# Patient Record
Sex: Female | Born: 1970 | Race: White | Hispanic: No | Marital: Married | Smoking: Current every day smoker
Health system: Southern US, Community
[De-identification: ages and names within clinical notes are randomized; demographics above are authoritative.]

## PROBLEM LIST (undated history)

## (undated) DIAGNOSIS — F4310 Post-traumatic stress disorder, unspecified: Secondary | ICD-10-CM

## (undated) DIAGNOSIS — Z8659 Personal history of other mental and behavioral disorders: Secondary | ICD-10-CM

## (undated) DIAGNOSIS — G4730 Sleep apnea, unspecified: Secondary | ICD-10-CM

## (undated) DIAGNOSIS — J449 Chronic obstructive pulmonary disease, unspecified: Secondary | ICD-10-CM

## (undated) DIAGNOSIS — E079 Disorder of thyroid, unspecified: Secondary | ICD-10-CM

## (undated) DIAGNOSIS — F329 Major depressive disorder, single episode, unspecified: Secondary | ICD-10-CM

## (undated) DIAGNOSIS — Q433 Congenital malformations of intestinal fixation: Secondary | ICD-10-CM

## (undated) DIAGNOSIS — M1990 Unspecified osteoarthritis, unspecified site: Secondary | ICD-10-CM

## (undated) DIAGNOSIS — F32A Depression, unspecified: Secondary | ICD-10-CM

## (undated) DIAGNOSIS — F909 Attention-deficit hyperactivity disorder, unspecified type: Secondary | ICD-10-CM

## (undated) DIAGNOSIS — F419 Anxiety disorder, unspecified: Secondary | ICD-10-CM

## (undated) DIAGNOSIS — F319 Bipolar disorder, unspecified: Secondary | ICD-10-CM

## (undated) DIAGNOSIS — J45909 Unspecified asthma, uncomplicated: Secondary | ICD-10-CM

## (undated) HISTORY — DX: Anxiety disorder, unspecified: F41.9

## (undated) HISTORY — DX: Personal history of other mental and behavioral disorders: Z86.59

## (undated) HISTORY — DX: Disorder of thyroid, unspecified: E07.9

## (undated) HISTORY — PX: ABDOMINAL SURGERY: SHX537

## (undated) HISTORY — DX: Post-traumatic stress disorder, unspecified: F43.10

## (undated) HISTORY — DX: Bipolar disorder, unspecified: F31.9

## (undated) HISTORY — DX: Attention-deficit hyperactivity disorder, unspecified type: F90.9

## (undated) HISTORY — DX: Major depressive disorder, single episode, unspecified: F32.9

## (undated) HISTORY — DX: Depression, unspecified: F32.A

## (undated) HISTORY — PX: ABDOMINAL HYSTERECTOMY: SHX81

---

## 2003-07-04 ENCOUNTER — Emergency Department (HOSPITAL_COMMUNITY): Admission: EM | Admit: 2003-07-04 | Discharge: 2003-07-04 | Payer: Self-pay | Admitting: Emergency Medicine

## 2005-03-19 ENCOUNTER — Ambulatory Visit: Payer: Self-pay | Admitting: Family Medicine

## 2005-08-20 ENCOUNTER — Emergency Department: Payer: Self-pay | Admitting: Internal Medicine

## 2005-09-01 ENCOUNTER — Emergency Department: Payer: Self-pay | Admitting: Emergency Medicine

## 2005-09-27 ENCOUNTER — Emergency Department: Payer: Self-pay | Admitting: Emergency Medicine

## 2005-09-30 ENCOUNTER — Other Ambulatory Visit: Payer: Self-pay

## 2005-09-30 ENCOUNTER — Inpatient Hospital Stay: Payer: Self-pay | Admitting: Internal Medicine

## 2005-09-30 ENCOUNTER — Emergency Department: Payer: Self-pay | Admitting: Emergency Medicine

## 2005-10-18 ENCOUNTER — Other Ambulatory Visit: Payer: Self-pay

## 2005-10-19 ENCOUNTER — Observation Stay: Payer: Self-pay | Admitting: Internal Medicine

## 2005-11-25 ENCOUNTER — Ambulatory Visit: Payer: Self-pay | Admitting: Physician Assistant

## 2006-01-28 ENCOUNTER — Ambulatory Visit: Payer: Self-pay

## 2006-02-01 ENCOUNTER — Other Ambulatory Visit: Payer: Self-pay

## 2006-02-01 ENCOUNTER — Emergency Department: Payer: Self-pay | Admitting: Emergency Medicine

## 2006-02-18 ENCOUNTER — Emergency Department: Payer: Self-pay | Admitting: Emergency Medicine

## 2006-03-24 ENCOUNTER — Emergency Department: Payer: Self-pay | Admitting: Internal Medicine

## 2006-10-11 ENCOUNTER — Emergency Department: Payer: Self-pay | Admitting: Emergency Medicine

## 2006-11-15 ENCOUNTER — Emergency Department: Payer: Self-pay | Admitting: Unknown Physician Specialty

## 2006-11-15 ENCOUNTER — Other Ambulatory Visit: Payer: Self-pay

## 2007-01-09 ENCOUNTER — Inpatient Hospital Stay: Payer: Self-pay | Admitting: Unknown Physician Specialty

## 2007-01-09 ENCOUNTER — Other Ambulatory Visit: Payer: Self-pay

## 2007-02-20 ENCOUNTER — Emergency Department: Payer: Self-pay | Admitting: Emergency Medicine

## 2007-04-30 ENCOUNTER — Emergency Department: Payer: Self-pay | Admitting: Emergency Medicine

## 2007-05-01 ENCOUNTER — Ambulatory Visit: Payer: Self-pay | Admitting: Podiatry

## 2008-01-08 ENCOUNTER — Ambulatory Visit: Payer: Self-pay | Admitting: Family Medicine

## 2008-02-08 ENCOUNTER — Emergency Department: Payer: Self-pay

## 2008-03-16 ENCOUNTER — Emergency Department: Payer: Self-pay | Admitting: Unknown Physician Specialty

## 2008-04-17 ENCOUNTER — Emergency Department: Payer: Self-pay | Admitting: Emergency Medicine

## 2008-05-07 ENCOUNTER — Other Ambulatory Visit: Payer: Self-pay

## 2008-05-07 ENCOUNTER — Inpatient Hospital Stay: Payer: Self-pay | Admitting: Internal Medicine

## 2008-05-08 ENCOUNTER — Inpatient Hospital Stay: Payer: Self-pay | Admitting: Psychiatry

## 2008-07-24 ENCOUNTER — Ambulatory Visit: Payer: Self-pay | Admitting: Urology

## 2008-10-10 ENCOUNTER — Encounter
Admission: RE | Admit: 2008-10-10 | Discharge: 2008-10-10 | Payer: Self-pay | Admitting: Physical Medicine & Rehabilitation

## 2009-01-17 ENCOUNTER — Inpatient Hospital Stay: Payer: Self-pay | Admitting: Psychiatry

## 2009-02-11 ENCOUNTER — Inpatient Hospital Stay: Payer: Self-pay | Admitting: Internal Medicine

## 2009-06-09 ENCOUNTER — Inpatient Hospital Stay: Payer: Self-pay | Admitting: Psychiatry

## 2009-08-11 ENCOUNTER — Ambulatory Visit: Payer: Self-pay | Admitting: Pain Medicine

## 2009-08-29 ENCOUNTER — Ambulatory Visit: Payer: Self-pay | Admitting: Unknown Physician Specialty

## 2009-09-08 ENCOUNTER — Ambulatory Visit: Payer: Self-pay | Admitting: Pain Medicine

## 2009-09-24 ENCOUNTER — Emergency Department: Payer: Self-pay | Admitting: Emergency Medicine

## 2009-11-27 ENCOUNTER — Ambulatory Visit: Payer: Self-pay | Admitting: Pain Medicine

## 2009-12-08 ENCOUNTER — Ambulatory Visit: Payer: Self-pay | Admitting: Pain Medicine

## 2010-04-28 ENCOUNTER — Ambulatory Visit: Payer: Self-pay | Admitting: Pain Medicine

## 2010-05-04 ENCOUNTER — Ambulatory Visit: Payer: Self-pay | Admitting: Pain Medicine

## 2010-05-26 ENCOUNTER — Ambulatory Visit: Payer: Self-pay | Admitting: Pain Medicine

## 2010-06-03 ENCOUNTER — Ambulatory Visit: Payer: Self-pay | Admitting: Pain Medicine

## 2010-06-23 ENCOUNTER — Ambulatory Visit: Payer: Self-pay | Admitting: Pain Medicine

## 2010-06-26 ENCOUNTER — Emergency Department: Payer: Self-pay | Admitting: Internal Medicine

## 2010-07-01 ENCOUNTER — Ambulatory Visit: Payer: Self-pay | Admitting: Pain Medicine

## 2010-07-06 ENCOUNTER — Emergency Department: Payer: Self-pay | Admitting: Emergency Medicine

## 2010-07-13 ENCOUNTER — Ambulatory Visit: Payer: Self-pay | Admitting: Gastroenterology

## 2010-08-04 ENCOUNTER — Ambulatory Visit: Payer: Self-pay | Admitting: Pain Medicine

## 2010-08-12 ENCOUNTER — Ambulatory Visit: Payer: Self-pay

## 2010-08-20 ENCOUNTER — Inpatient Hospital Stay: Payer: Self-pay

## 2010-08-21 LAB — PATHOLOGY REPORT

## 2010-09-01 ENCOUNTER — Ambulatory Visit: Payer: Self-pay | Admitting: Pain Medicine

## 2011-03-15 ENCOUNTER — Inpatient Hospital Stay: Payer: Self-pay | Admitting: Internal Medicine

## 2011-03-17 ENCOUNTER — Inpatient Hospital Stay: Payer: Self-pay | Admitting: Psychiatry

## 2011-08-31 ENCOUNTER — Emergency Department: Payer: Self-pay | Admitting: Emergency Medicine

## 2011-09-01 ENCOUNTER — Emergency Department (HOSPITAL_COMMUNITY)
Admission: EM | Admit: 2011-09-01 | Discharge: 2011-09-01 | Disposition: A | Discharge status: Home / Self Care | Payer: Medicaid Other | Attending: Emergency Medicine | Admitting: Emergency Medicine

## 2011-09-01 ENCOUNTER — Emergency Department (HOSPITAL_COMMUNITY): Payer: Medicaid Other

## 2011-09-01 DIAGNOSIS — F319 Bipolar disorder, unspecified: Secondary | ICD-10-CM | POA: Insufficient documentation

## 2011-09-01 DIAGNOSIS — F209 Schizophrenia, unspecified: Secondary | ICD-10-CM | POA: Insufficient documentation

## 2011-09-01 DIAGNOSIS — R51 Headache: Secondary | ICD-10-CM | POA: Insufficient documentation

## 2011-09-01 DIAGNOSIS — E7800 Pure hypercholesterolemia, unspecified: Secondary | ICD-10-CM | POA: Insufficient documentation

## 2011-09-01 DIAGNOSIS — T148XXA Other injury of unspecified body region, initial encounter: Secondary | ICD-10-CM | POA: Insufficient documentation

## 2011-09-01 DIAGNOSIS — M129 Arthropathy, unspecified: Secondary | ICD-10-CM | POA: Insufficient documentation

## 2011-09-01 DIAGNOSIS — F341 Dysthymic disorder: Secondary | ICD-10-CM | POA: Insufficient documentation

## 2011-09-03 ENCOUNTER — Emergency Department: Payer: Self-pay | Admitting: Unknown Physician Specialty

## 2012-01-01 ENCOUNTER — Emergency Department: Payer: Self-pay | Admitting: Emergency Medicine

## 2012-01-16 ENCOUNTER — Emergency Department: Payer: Self-pay | Admitting: Emergency Medicine

## 2012-01-17 LAB — URINALYSIS, COMPLETE
Bilirubin,UR: NEGATIVE
Ph: 5 (ref 4.5–8.0)
RBC,UR: 3 /HPF (ref 0–5)
Specific Gravity: 1.026 (ref 1.003–1.030)
WBC UR: 1 /HPF (ref 0–5)

## 2012-01-17 LAB — CBC
HCT: 39.7 % (ref 35.0–47.0)
HGB: 13.4 g/dL (ref 12.0–16.0)
MCV: 91 fL (ref 80–100)
RBC: 4.36 10*6/uL (ref 3.80–5.20)
RDW: 13.4 % (ref 11.5–14.5)
WBC: 11.3 10*3/uL — ABNORMAL HIGH (ref 3.6–11.0)

## 2012-01-17 LAB — DRUG SCREEN, URINE
Barbiturates, Ur Screen: NEGATIVE (ref ?–200)
Cannabinoid 50 Ng, Ur ~~LOC~~: POSITIVE (ref ?–50)
MDMA (Ecstasy)Ur Screen: NEGATIVE (ref ?–500)

## 2012-01-17 LAB — COMPREHENSIVE METABOLIC PANEL
Albumin: 3.8 g/dL (ref 3.4–5.0)
Alkaline Phosphatase: 110 U/L (ref 50–136)
BUN: 17 mg/dL (ref 7–18)
Bilirubin,Total: 0.4 mg/dL (ref 0.2–1.0)
Creatinine: 0.75 mg/dL (ref 0.60–1.30)
Glucose: 117 mg/dL — ABNORMAL HIGH (ref 65–99)
Osmolality: 288 (ref 275–301)
SGPT (ALT): 38 U/L
Sodium: 143 mmol/L (ref 136–145)

## 2012-01-17 LAB — ETHANOL
Ethanol %: <0.003 % (ref 0.000–0.080)
Ethanol: <3 mg/dL

## 2012-01-17 LAB — ACETAMINOPHEN LEVEL: Acetaminophen: <2 ug/mL

## 2012-01-17 LAB — SALICYLATE LEVEL: Salicylates, Serum: 4.3 mg/dL — ABNORMAL HIGH

## 2012-01-17 LAB — PREGNANCY, URINE: Pregnancy Test, Urine: NEGATIVE m[IU]/mL

## 2012-01-17 LAB — TSH: Thyroid Stimulating Horm: 3.3 u[IU]/mL

## 2012-04-09 ENCOUNTER — Emergency Department: Payer: Self-pay | Admitting: *Deleted

## 2012-07-13 ENCOUNTER — Emergency Department: Payer: Self-pay | Admitting: Emergency Medicine

## 2012-07-13 LAB — BASIC METABOLIC PANEL
Anion Gap: 6 — ABNORMAL LOW (ref 7–16)
BUN: 12 mg/dL (ref 7–18)
Chloride: 110 mmol/L — ABNORMAL HIGH (ref 98–107)
Creatinine: 0.83 mg/dL (ref 0.60–1.30)
EGFR (Non-African Amer.): >60
Glucose: 109 mg/dL — ABNORMAL HIGH (ref 65–99)
Osmolality: 287 (ref 275–301)
Potassium: 3.4 mmol/L — ABNORMAL LOW (ref 3.5–5.1)

## 2012-07-15 ENCOUNTER — Inpatient Hospital Stay: Payer: Self-pay | Admitting: Student

## 2012-07-15 LAB — COMPREHENSIVE METABOLIC PANEL
Alkaline Phosphatase: 85 U/L (ref 50–136)
BUN: 14 mg/dL (ref 7–18)
Creatinine: 0.9 mg/dL (ref 0.60–1.30)
EGFR (Non-African Amer.): >60
Glucose: 89 mg/dL (ref 65–99)
Osmolality: 287 (ref 275–301)
Potassium: 3.7 mmol/L (ref 3.5–5.1)
Sodium: 144 mmol/L (ref 136–145)

## 2012-07-15 LAB — URINALYSIS, COMPLETE
Bacteria: NONE SEEN
Glucose,UR: NEGATIVE mg/dL (ref 0–75)
Leukocyte Esterase: NEGATIVE
Nitrite: NEGATIVE
Protein: NEGATIVE
RBC,UR: <1 /HPF (ref 0–5)
Specific Gravity: 1.03 (ref 1.003–1.030)
WBC UR: NONE SEEN /HPF (ref 0–5)

## 2012-07-15 LAB — DRUG SCREEN, URINE
Amphetamines, Ur Screen: NEGATIVE (ref ?–1000)
Cocaine Metabolite,Ur ~~LOC~~: NEGATIVE (ref ?–300)
MDMA (Ecstasy)Ur Screen: NEGATIVE (ref ?–500)
Methadone, Ur Screen: NEGATIVE (ref ?–300)
Opiate, Ur Screen: POSITIVE (ref ?–300)
Tricyclic, Ur Screen: NEGATIVE (ref ?–1000)

## 2012-07-15 LAB — CBC
HCT: 40.4 % (ref 35.0–47.0)
HGB: 13 g/dL (ref 12.0–16.0)
MCHC: 32.3 g/dL (ref 32.0–36.0)
WBC: 9.2 10*3/uL (ref 3.6–11.0)

## 2012-07-15 LAB — PREGNANCY, URINE: Pregnancy Test, Urine: NEGATIVE m[IU]/mL

## 2012-07-15 LAB — ETHANOL
Ethanol %: <0.003 % (ref 0.000–0.080)
Ethanol: <3 mg/dL

## 2012-07-15 LAB — ACETAMINOPHEN LEVEL: Acetaminophen: 23 ug/mL

## 2012-07-15 LAB — TSH: Thyroid Stimulating Horm: 0.51 u[IU]/mL

## 2012-07-16 LAB — BASIC METABOLIC PANEL
BUN: 11 mg/dL (ref 7–18)
Chloride: 116 mmol/L — ABNORMAL HIGH (ref 98–107)
EGFR (Non-African Amer.): >60
Glucose: 92 mg/dL (ref 65–99)

## 2012-07-16 LAB — CBC WITH DIFFERENTIAL/PLATELET
Basophil #: 0.1 10*3/uL (ref 0.0–0.1)
Eosinophil %: 1.5 %
HGB: 11 g/dL — ABNORMAL LOW (ref 12.0–16.0)
Lymphocyte %: 24.8 %
MCH: 29.5 pg (ref 26.0–34.0)
MCV: 91 fL (ref 80–100)
Monocyte %: 4.5 %
Neutrophil #: 7.6 10*3/uL — ABNORMAL HIGH (ref 1.4–6.5)
RBC: 3.74 10*6/uL — ABNORMAL LOW (ref 3.80–5.20)

## 2012-07-16 LAB — COMPREHENSIVE METABOLIC PANEL: Bilirubin,Total: 0.2 mg/dL (ref 0.2–1.0)

## 2013-07-20 DIAGNOSIS — E041 Nontoxic single thyroid nodule: Secondary | ICD-10-CM | POA: Insufficient documentation

## 2013-07-20 DIAGNOSIS — E785 Hyperlipidemia, unspecified: Secondary | ICD-10-CM | POA: Insufficient documentation

## 2013-07-20 DIAGNOSIS — F1911 Other psychoactive substance abuse, in remission: Secondary | ICD-10-CM | POA: Insufficient documentation

## 2013-07-20 DIAGNOSIS — G4700 Insomnia, unspecified: Secondary | ICD-10-CM | POA: Insufficient documentation

## 2013-07-20 DIAGNOSIS — R635 Abnormal weight gain: Secondary | ICD-10-CM | POA: Insufficient documentation

## 2013-07-20 DIAGNOSIS — M549 Dorsalgia, unspecified: Secondary | ICD-10-CM | POA: Insufficient documentation

## 2013-07-20 DIAGNOSIS — Z8679 Personal history of other diseases of the circulatory system: Secondary | ICD-10-CM | POA: Insufficient documentation

## 2013-12-24 ENCOUNTER — Emergency Department: Payer: Self-pay | Admitting: Emergency Medicine

## 2014-01-04 ENCOUNTER — Emergency Department: Payer: Self-pay | Admitting: Emergency Medicine

## 2014-06-17 ENCOUNTER — Emergency Department: Payer: Self-pay | Admitting: Emergency Medicine

## 2014-06-17 LAB — URINALYSIS, COMPLETE
Bacteria: NONE SEEN
Bilirubin,UR: NEGATIVE
GLUCOSE, UR: NEGATIVE mg/dL (ref 0–75)
Leukocyte Esterase: NEGATIVE
NITRITE: NEGATIVE
Ph: 6 (ref 4.5–8.0)
Protein: 30
RBC,UR: 5 /HPF (ref 0–5)
SPECIFIC GRAVITY: 1.024 (ref 1.003–1.030)
Squamous Epithelial: 1
WBC UR: 1 /HPF (ref 0–5)

## 2014-06-17 LAB — COMPREHENSIVE METABOLIC PANEL
ALK PHOS: 133 U/L — AB
ALT: 19 U/L (ref 12–78)
ANION GAP: 14 (ref 7–16)
AST: 15 U/L (ref 15–37)
Albumin: 3.8 g/dL (ref 3.4–5.0)
BUN: 13 mg/dL (ref 7–18)
Bilirubin,Total: 0.7 mg/dL (ref 0.2–1.0)
Calcium, Total: 9.7 mg/dL (ref 8.5–10.1)
Chloride: 104 mmol/L (ref 98–107)
Co2: 19 mmol/L — ABNORMAL LOW (ref 21–32)
Creatinine: 0.73 mg/dL (ref 0.60–1.30)
GLUCOSE: 85 mg/dL (ref 65–99)
OSMOLALITY: 273 (ref 275–301)
Potassium: 3.3 mmol/L — ABNORMAL LOW (ref 3.5–5.1)
Sodium: 137 mmol/L (ref 136–145)
TOTAL PROTEIN: 8.3 g/dL — AB (ref 6.4–8.2)

## 2014-06-17 LAB — CBC
HCT: 45.9 % (ref 35.0–47.0)
HGB: 14.6 g/dL (ref 12.0–16.0)
MCH: 27.3 pg (ref 26.0–34.0)
MCHC: 31.9 g/dL — ABNORMAL LOW (ref 32.0–36.0)
MCV: 86 fL (ref 80–100)
PLATELETS: 214 10*3/uL (ref 150–440)
RBC: 5.36 10*6/uL — AB (ref 3.80–5.20)
RDW: 14.6 % — AB (ref 11.5–14.5)
WBC: 16.6 10*3/uL — ABNORMAL HIGH (ref 3.6–11.0)

## 2014-06-17 LAB — DRUG SCREEN, URINE
AMPHETAMINES, UR SCREEN: NEGATIVE (ref ?–1000)
BARBITURATES, UR SCREEN: NEGATIVE (ref ?–200)
Benzodiazepine, Ur Scrn: POSITIVE (ref ?–200)
COCAINE METABOLITE, UR ~~LOC~~: NEGATIVE (ref ?–300)
Cannabinoid 50 Ng, Ur ~~LOC~~: POSITIVE (ref ?–50)
MDMA (Ecstasy)Ur Screen: NEGATIVE (ref ?–500)
Methadone, Ur Screen: NEGATIVE (ref ?–300)
Opiate, Ur Screen: NEGATIVE (ref ?–300)
Phencyclidine (PCP) Ur S: NEGATIVE (ref ?–25)
TRICYCLIC, UR SCREEN: NEGATIVE (ref ?–1000)

## 2014-09-22 ENCOUNTER — Observation Stay: Payer: Self-pay | Admitting: Internal Medicine

## 2014-09-22 LAB — ETHANOL: Ethanol: <3 mg/dL

## 2014-09-22 LAB — URINALYSIS, COMPLETE
BILIRUBIN, UR: NEGATIVE
Blood: NEGATIVE
Glucose,UR: NEGATIVE mg/dL (ref 0–75)
KETONE: NEGATIVE
Leukocyte Esterase: NEGATIVE
NITRITE: NEGATIVE
PROTEIN: NEGATIVE
Ph: 5 (ref 4.5–8.0)
Specific Gravity: 1.025 (ref 1.003–1.030)

## 2014-09-22 LAB — COMPREHENSIVE METABOLIC PANEL
ALBUMIN: 3.7 g/dL (ref 3.4–5.0)
ALT: 16 U/L
ANION GAP: 5 — AB (ref 7–16)
Alkaline Phosphatase: 128 U/L — ABNORMAL HIGH
BILIRUBIN TOTAL: 0.5 mg/dL (ref 0.2–1.0)
BUN: 13 mg/dL (ref 7–18)
CALCIUM: 8.5 mg/dL (ref 8.5–10.1)
CO2: 28 mmol/L (ref 21–32)
CREATININE: 0.85 mg/dL (ref 0.60–1.30)
Chloride: 103 mmol/L (ref 98–107)
EGFR (African American): >60
GLUCOSE: 78 mg/dL (ref 65–99)
OSMOLALITY: 271 (ref 275–301)
POTASSIUM: 3.4 mmol/L — AB (ref 3.5–5.1)
SGOT(AST): 17 U/L (ref 15–37)
SODIUM: 136 mmol/L (ref 136–145)
TOTAL PROTEIN: 7.7 g/dL (ref 6.4–8.2)

## 2014-09-22 LAB — DRUG SCREEN, URINE
Amphetamines, Ur Screen: POSITIVE (ref ?–1000)
BARBITURATES, UR SCREEN: NEGATIVE (ref ?–200)
Benzodiazepine, Ur Scrn: POSITIVE (ref ?–200)
CANNABINOID 50 NG, UR ~~LOC~~: POSITIVE (ref ?–50)
Cocaine Metabolite,Ur ~~LOC~~: NEGATIVE (ref ?–300)
MDMA (ECSTASY) UR SCREEN: NEGATIVE (ref ?–500)
METHADONE, UR SCREEN: NEGATIVE (ref ?–300)
OPIATE, UR SCREEN: POSITIVE (ref ?–300)
PHENCYCLIDINE (PCP) UR S: NEGATIVE (ref ?–25)
Tricyclic, Ur Screen: NEGATIVE (ref ?–1000)

## 2014-09-22 LAB — SALICYLATE LEVEL
SALICYLATES, SERUM: 3.8 mg/dL — AB
Salicylates, Serum: 4.4 mg/dL — ABNORMAL HIGH

## 2014-09-22 LAB — CBC
HCT: 42.6 % (ref 35.0–47.0)
HGB: 13.7 g/dL (ref 12.0–16.0)
MCH: 28.9 pg (ref 26.0–34.0)
MCHC: 32.2 g/dL (ref 32.0–36.0)
MCV: 90 fL (ref 80–100)
PLATELETS: 216 10*3/uL (ref 150–440)
RBC: 4.74 10*6/uL (ref 3.80–5.20)
RDW: 14.4 % (ref 11.5–14.5)
WBC: 10.8 10*3/uL (ref 3.6–11.0)

## 2014-09-22 LAB — AMMONIA: AMMONIA, PLASMA: 24 umol/L (ref 11–32)

## 2014-09-22 LAB — ACETAMINOPHEN LEVEL: Acetaminophen: <2 ug/mL

## 2014-09-22 LAB — TSH: Thyroid Stimulating Horm: 1.5 u[IU]/mL

## 2015-03-08 ENCOUNTER — Emergency Department (HOSPITAL_COMMUNITY)
Admission: EM | Admit: 2015-03-08 | Discharge: 2015-03-08 | Disposition: A | Discharge status: Home / Self Care | Payer: Medicare Other | Attending: Emergency Medicine | Admitting: Emergency Medicine

## 2015-03-08 ENCOUNTER — Emergency Department (HOSPITAL_COMMUNITY): Payer: Medicare Other

## 2015-03-08 ENCOUNTER — Encounter (HOSPITAL_COMMUNITY): Payer: Self-pay

## 2015-03-08 DIAGNOSIS — W108XXA Fall (on) (from) other stairs and steps, initial encounter: Secondary | ICD-10-CM | POA: Diagnosis not present

## 2015-03-08 DIAGNOSIS — Z720 Tobacco use: Secondary | ICD-10-CM | POA: Insufficient documentation

## 2015-03-08 DIAGNOSIS — Z880 Allergy status to penicillin: Secondary | ICD-10-CM | POA: Diagnosis not present

## 2015-03-08 DIAGNOSIS — Y998 Other external cause status: Secondary | ICD-10-CM | POA: Insufficient documentation

## 2015-03-08 DIAGNOSIS — J449 Chronic obstructive pulmonary disease, unspecified: Secondary | ICD-10-CM | POA: Insufficient documentation

## 2015-03-08 DIAGNOSIS — Z8669 Personal history of other diseases of the nervous system and sense organs: Secondary | ICD-10-CM | POA: Insufficient documentation

## 2015-03-08 DIAGNOSIS — S46002A Unspecified injury of muscle(s) and tendon(s) of the rotator cuff of left shoulder, initial encounter: Secondary | ICD-10-CM

## 2015-03-08 DIAGNOSIS — Y9289 Other specified places as the place of occurrence of the external cause: Secondary | ICD-10-CM | POA: Insufficient documentation

## 2015-03-08 DIAGNOSIS — S4992XA Unspecified injury of left shoulder and upper arm, initial encounter: Secondary | ICD-10-CM | POA: Diagnosis not present

## 2015-03-08 DIAGNOSIS — Y9389 Activity, other specified: Secondary | ICD-10-CM | POA: Insufficient documentation

## 2015-03-08 HISTORY — DX: Sleep apnea, unspecified: G47.30

## 2015-03-08 HISTORY — DX: Chronic obstructive pulmonary disease, unspecified: J44.9

## 2015-03-08 MED ORDER — OXYCODONE-ACETAMINOPHEN 5-325 MG PO TABS
1.0000 | ORAL_TABLET | Freq: Once | ORAL | Status: AC
  Administered 2015-03-08: 1 via ORAL
  Filled 2015-03-08: qty 1

## 2015-03-08 MED ORDER — HYDROCODONE-ACETAMINOPHEN 5-325 MG PO TABS: ORAL_TABLET | ORAL | Status: DC

## 2015-03-08 NOTE — ED Notes (Signed)
Pt reports yesterday pt moving dresser fell down 4 stairs landed on left shoulder, pt unable to move left arm d/t pain.

## 2015-03-08 NOTE — Discharge Instructions (Signed)
Only use the arm sling for up to 2 days. Take the arm out and rotate the shoulder every 4 hours.  Take vicodin for breakthrough pain, do not drink alcohol, drive, care for children or do other critical tasks while taking vicodin.  Please follow with your primary care doctor in the next 2 days for a check-up. They must obtain records for further management.   Do not hesitate to return to the Emergency Department for any new, worsening or concerning symptoms.    Rotator Cuff Injury Rotator cuff injury is any type of injury to the set of muscles and tendons that make up the stabilizing unit of your shoulder. This unit holds the ball of your upper arm bone (humerus) in the socket of your shoulder blade (scapula).  CAUSES Injuries to your rotator cuff most commonly come from sports or activities that cause your arm to be moved repeatedly over your head. Examples of this include throwing, weight lifting, swimming, or racquet sports. Long lasting (chronic) irritation of your rotator cuff can cause soreness and swelling (inflammation), bursitis, and eventual damage to your tendons, such as a tear (rupture). SIGNS AND SYMPTOMS Acute rotator cuff tear:  Sudden tearing sensation followed by severe pain shooting from your upper shoulder down your arm toward your elbow.  Decreased range of motion of your shoulder because of pain and muscle spasm.  Severe pain.  Inability to raise your arm out to the side because of pain and loss of muscle power (large tears). Chronic rotator cuff tear:  Pain that usually is worse at night and may interfere with sleep.  Gradual weakness and decreased shoulder motion as the pain worsens.  Decreased range of motion. Rotator cuff tendinitis:  Deep ache in your shoulder and the outside upper arm over your shoulder.  Pain that comes on gradually and becomes worse when lifting your arm to the side or turning it inward. DIAGNOSIS Rotator cuff injury is diagnosed  through a medical history, physical exam, and imaging exam. The medical history helps determine the type of rotator cuff injury. Your health care provider will look at your injured shoulder, feel the injured area, and ask you to move your shoulder in different positions. X-ray exams typically are done to rule out other causes of shoulder pain, such as fractures. MRI is the exam of choice for the most severe shoulder injuries because the images show muscles and tendons.  TREATMENT  Chronic tear:  Medicine for pain, such as acetaminophen or ibuprofen.  Physical therapy and range-of-motion exercises may be helpful in maintaining shoulder function and strength.  Steroid injections into your shoulder joint.  Surgical repair of the rotator cuff if the injury does not heal with noninvasive treatment. Acute tear:  Anti-inflammatory medicines such as ibuprofen and naproxen to help reduce pain and swelling.  A sling to help support your arm and rest your rotator cuff muscles. Long-term use of a sling is not advised. It may cause significant stiffening of the shoulder joint.  Surgery may be considered within a few weeks, especially in younger, active people, to return the shoulder to full function.  Indications for surgical treatment include the following:  Age younger than 60 years.  Rotator cuff tears that are complete.  Physical therapy, rest, and anti-inflammatory medicines have been used for 6-8 weeks, with no improvement.  Employment or sporting activity that requires constant shoulder use. Tendinitis:  Anti-inflammatory medicines such as ibuprofen and naproxen to help reduce pain and swelling.  A sling to  help support your arm and rest your rotator cuff muscles. Long-term use of a sling is not advised. It may cause significant stiffening of the shoulder joint.  Severe tendinitis may require:  Steroid injections into your shoulder joint.  Physical therapy.  Surgery. HOME CARE  INSTRUCTIONS   Apply ice to your injury:  Put ice in a plastic bag.  Place a towel between your skin and the bag.  Leave the ice on for 20 minutes, 2-3 times a day.  If you have a shoulder immobilizer (sling and straps), wear it until told otherwise by your health care provider.  You may want to sleep on several pillows or in a recliner at night to lessen swelling and pain.  Only take over-the-counter or prescription medicines for pain, discomfort, or fever as directed by your health care provider.  Do simple hand squeezing exercises with a soft rubber ball to decrease hand swelling. SEEK MEDICAL CARE IF:   Your shoulder pain increases, or new pain or numbness develops in your arm, hand, or fingers.  Your hand or fingers are colder than your other hand. SEEK IMMEDIATE MEDICAL CARE IF:   Your arm, hand, or fingers are numb or tingling.  Your arm, hand, or fingers are increasingly swollen and painful, or they turn white or blue. MAKE SURE YOU:  Understand these instructions.  Will watch your condition.  Will get help right away if you are not doing well or get worse. Document Released: 12/10/2000 Document Revised: 12/18/2013 Document Reviewed: 07/25/2013 [facility] Patient Information 2015 [employer], [place]. This information is not intended to replace advice given to you by your health care provider. Make sure you discuss any questions you have with your health care provider.

## 2015-03-08 NOTE — ED Provider Notes (Signed)
CSN: [id number]     Arrival date & time 03/08/15  1332 History  This chart was scribed for Destiny Emery, PA-C, working with Benjiman Core, MD by Elon Spanner, ED Scribe. This patient was seen in room TR06C/TR06C and the patient's care was started at 2:30 PM.   Chief Complaint  Patient presents with  . Shoulder Pain   The history is provided by the patient. No language interpreter was used.   HPI Comments: Destiny Simmons is a 44 y.o. female who presents to the Emergency Department complaining of a left shoulder injury that occurred yesterday at noon.  She reports that she was moving her dresser when she fell down 4 stairs, landing on her back and left shoulder.   She denies head trauma, LOC during fall.  She complains currently of 8/10 left shoulder pain.  She has taken ibuprofen without relief.  Denies numbness, weakness. States pain is exacerbated by movement and palpation. Prior history of trauma or surgeries to the affected joint  Past Medical History  Diagnosis Date  . Sleep apnea   . COPD (chronic obstructive pulmonary disease)    Past Surgical History  Procedure Laterality Date  . Abdominal hysterectomy     History reviewed. No pertinent family history. History  Substance Use Topics  . Smoking status: Current Every Day Smoker -- 1.00 packs/day    Types: Cigarettes  . Smokeless tobacco: Not on file  . Alcohol Use: No   OB History    No data available     Review of Systems A complete 10 system review of systems was obtained and all systems are negative except as noted in the HPI and PMH.    Allergies  Penicillins  Home Medications   Prior to Admission medications   Not on File   BP 126/90 mmHg  Pulse 86  Temp(Src) 98 F (36.7 C) (Oral)  Resp 18  SpO2 98% Physical Exam  Constitutional: She is oriented to person, place, and time. She appears well-developed and well-nourished. No distress.  HENT:  Head: Normocephalic and atraumatic.  Mouth/Throat:  Oropharynx is clear and moist.  Eyes: Conjunctivae and EOM are normal. Pupils are equal, round, and reactive to light.  Neck: Normal range of motion. Neck supple. No tracheal deviation present.  Cardiovascular: Normal rate, regular rhythm and intact distal pulses.   Pulmonary/Chest: Effort normal and breath sounds normal. No respiratory distress.  Abdominal: Soft. There is no tenderness.  Musculoskeletal: Normal range of motion. She exhibits no edema or tenderness.  Right shoulder:  Shoulder with no deformity. Reduced range of motion in extension however she is able to abduct greater than 90. No focal tenderness to palpation along the rotator cuff musculature, she is diffusely tender.   Drop arm negative. Neurovascularly intact   Neurological: She is alert and oriented to person, place, and time.  Skin: Skin is warm and dry. She is not diaphoretic.  Psychiatric: She has a normal mood and affect. Her behavior is normal.  Nursing note and vitals reviewed.   ED Course  Procedures (including critical care time)  DIAGNOSTIC STUDIES: Oxygen Saturation is 100% on RA, normal by my interpretation.    COORDINATION OF CARE:  2:34 PM Discussed treatment plan with patient at bedside.  Patient acknowledges and agrees with plan.    Labs Review Labs Reviewed - No data to display  Imaging Review No results found.   EKG Interpretation None      MDM   Final diagnoses:  None  Filed Vitals:   03/08/15 1628  BP: 126/90  Pulse: 86  Temp: 98 F (36.7 C)  TempSrc: Oral  Resp: 18  SpO2: 98%    Medications  oxyCODONE-acetaminophen (PERCOCET/ROXICET) 5-325 MG per tablet 1 tablet (1 tablet Oral Given 03/08/15 1446)    Destiny Simmons is a pleasant 44 y.o. female presenting with right shoulder pain status post slip and fall several days ago. Physical exam with no acute abnormality, x-rays negative, she is neurovascularly intact. Patient is given sling, pain medications anastrozole  close with orthopedics.  Evaluation does not show pathology that would require ongoing emergent intervention or inpatient treatment. Pt is hemodynamically stable and mentating appropriately. Discussed findings and plan with patient/guardian, who agrees with care plan. All questions answered. Return precautions discussed and outpatient follow up given.   Discharge Medication List as of 03/08/2015  4:27 PM    START taking these medications   Details  HYDROcodone-acetaminophen (NORCO/VICODIN) 5-325 MG per tablet Take 1-2 tablets by mouth every 6 hours as needed for pain and/or cough., Print         I personally performed the services described in this documentation, which was scribed in my presence. The recorded information has been reviewed and is accurate.    Destiny Emeryicole Anberlyn Feimster, PA-C 03/10/15 1454  Benjiman CoreNathan Pickering, MD 03/12/15 [phone]

## 2015-03-17 ENCOUNTER — Emergency Department: Payer: Self-pay | Admitting: Emergency Medicine

## 2015-04-02 ENCOUNTER — Ambulatory Visit
Admit: 2015-04-02 | Disposition: A | Discharge status: Home / Self Care | Payer: Self-pay | Attending: Gastroenterology | Admitting: Gastroenterology

## 2015-04-02 HISTORY — PX: COLONOSCOPY W/ BIOPSIES: SHX1374

## 2015-04-02 HISTORY — PX: ESOPHAGOGASTRODUODENOSCOPY ENDOSCOPY: SHX5814

## 2015-04-15 NOTE — Discharge Summary (Signed)
PATIENT NAME:  Destiny Simmons, Destiny Simmons MR#:  [id number] DATE OF BIRTH:  [birth year]  DATE OF ADMISSION:  07/15/2012 DATE OF DISCHARGE:  07/16/2012  CONSULTING PHYSICIAN: Dr. Guss Bundehalla from psychiatry.   CHIEF COMPLAINT: Overdose.   DISCHARGE DIAGNOSES:  1. Drug overdose, unintentional. 2. Mild hypernatremia. 3. Transient hypotension.   SECONDARY DIAGNOSES:  1. History of sleep apnea.  2. History of degenerative joint disease. 3. History of migraines. 4. History of drug abuse. 5. Tobacco abuse. 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 7. Depression, anxiety.  8. Hyperlipidemia.  9. Hypertension.   DISCHARGE MEDICATIONS:  1. Hydroxyzine 25 mg 1 tab 4 times a day as needed for itching. 2. Meloxicam 7.5 mg 1 tab 2 times a day.  3. Clonazepam 1 mg 3 times a day as needed for anxiety.  4. Carisoprodol 350 mg 1 tablet 4 times a day.  5. Omeprazole 40 mg daily.  6. Gabapentin 600 mg three times a day.  7. Venlafaxine 150 mg extended-release 1 cap daily.  8. Trazodone 150 mg daily.  9. Tramadol 50 mg 1 to 2 tabs every six hours as needed for pain. 10. Ambien 10 mg once a day. 11. Cymbalta 30 mg daily.  12. Saphris 10 mg once a day.   DIET: Low sodium.   ACTIVITY: As tolerated.   FOLLOW UP: Please follow up with her primary care physician in 1 to 2 days. Please follow up with your primary psychiatrist in 1 to 2 weeks.   DISPOSITION: Home.   HISTORY OF PRESENT ILLNESS: For full details of the history and physical, please see the dictation on 07/15/2012 by Dr. Juliene PinaMody, but briefly this is a 44 year old female who was brought in after a drug overdose. She was admitted to the Critical Care Unit for observation. On arrival she was found to have low blood pressure and transferred to Critical Care Unit.   LABORATORY, DIAGNOSTIC AND RADIOLOGICAL DATA: Initial creatinine 0.9, BUN 14, potassium 3.7, chloride 110. Sodium on discharge 147. Ethanol level below detection. LFTs on  arrival: Albumin 3.3, otherwise within normal limits. TSH 0.51. Urine toxicology positive for benzo, cannabinoids and opiates. Initial WBC 9.2, which trended slightly to 11.1 by discharge. Hemoglobin initially 13, hematocrit 40.4. Urinalysis not suggestive of infection. Serum salicylates 3.9. Serum acetaminophen 23. Pregnancy test was negative. X-ray of the chest findings could represent atelectasis or infiltrate, possibly with mild edema.   HOSPITAL COURSE: Hospital course was uncomplicated. She was admitted to Critical Care Unit and started with a sitter. When she came in she was on nonrebreather per records. Patient was noted to be hard to be aroused. Per history and physical she had overdosed on soma, Klonopin and Neurontin. Upon further conversation with the patient, the patient apparently had forgotten she had taken her medications and took two extra tablets of each of her medications. She was seen by psychiatry and per Dr. Guss Bundehalla no further medication changes were needed and she was cleared from psychiatry perspective. She had no suicidal or homicidal ideation and did not have any feelings of hurting herself. Her husband was in the room. She was aware of her actions. She stated that she just had forgotten that she had taken her medications. Apparently she was in a swimming pool given the weather and her husband was with her and patient had an episode of decreased responsiveness and her husband took her out of the pool and called EMS. Here she did well. She did have transient hypotension, however, did have multiple  boluses of normal saline. She had no fever and the next morning she had reverted to her normal baseline per patient and her husband, who is in the room. She was ambulating in the Critical Care Unit and after getting cleared per psychiatry she wanted to go home and therefore was discharged. Per Dr. Guss Bunde no medication changes were needed as patient had a follow up with Dr. Janeece Riggers, her psychiatrist as  an outpatient. She is also to follow up with her primary care physician. She had mild hypernatremia of 147 upon discharge, however, she had received multiple boluses of normal saline which also likely cause hemodilution of her hemoglobin and hematocrit.   DISPOSITION: Home.   CODE STATUS: FULL CODE.   ____________________________ Krystal Eaton, MD sa:cms D: 07/16/2012 13:17:43 ET T: 07/17/2012 12:54:01 ET JOB#: [id number]  cc: Krystal Eaton, MD, <Dictator> Meindert A. Lacie Scotts, MD  Krystal Eaton MD ELECTRONICALLY SIGNED 07/21/2012 19:12

## 2015-04-15 NOTE — H&P (Signed)
PATIENT NAME:  Destiny Simmons, Destiny C MR#:  [id number] DATE OF BIRTH:  1971-07-30  DATE OF ADMISSION:  07/15/2012  PRIMARY CARE PHYSICIAN: Per the medical chart, Dr. Evelene CroonMeindert Niemeyer.  CHIEF COMPLAINT: Overdose.   HISTORY OF PRESENT ILLNESS: The patient is a 44 year old female with past medical history per medical chart of sleep apnea, degenerative disk disease, migraines, drug abuse, tobacco abuse, borderline schizophrenic and histrionic personality disorder who presented after an overdose. I cannot get any information from the patient as she is sedated. Per the ER physician, the patient's husband called EMS. She may have taken numerous Klonopin, Soma, and Neurontin. Her [facility] called EMS from the [employer] [place]. She was found in a swimming pool. In the ER, she was given 2 liters of normal saline bolus due to low blood pressure as well as two IV doses of Narcan and this did not help with her current mental status.   REVIEW OF SYSTEMS: Unable to obtain due to the patient's current mental status.   PAST MEDICAL HISTORY: According to medical chart. 1. Sleep apnea.  2. Degenerative joint disease.  3. Migraines.  4. Drug abuse.  5. Tobacco abuse.  6. Borderline histrionic schizophrenic disorder.  7. Depression and anxiety.  8. Hyperlipidemia. 9. Hypertension.  PAST SURGICAL HISTORY:  1. Hysterectomy.  2. Tubal ligation.  3. Cesarean section.   ALLERGIES: According to medical chart, ciprofloxacin, codeine, Darvocet, Demerol, Keflex, Lamictal, penicillin, Vicodin, rubber gloves, codeine, tape, animal dander, and penicillin.   MEDICATIONS: According to medical chart.  1. Metoprolol XL 100 mg daily.  2. Mobic 15 mg daily.  3. Lyrica 50 mg twice a day. 4. Hydroxyzine 1 tablet four times daily p.r.n. itching.  5. Crestor 10 mg daily.  6. Clonazepam 0.5 mg twice a day. 7. Ambien 10 mg at bedtime.   FAMILY HISTORY: Unknown.   SOCIAL HISTORY: Unknown.  PHYSICAL EXAMINATION:   VITAL SIGNS:  Temperature 98.6, pulse 77, respirations 14, blood pressure 84/65, and saturation 100% on nonrebreather.   GENERAL: The patient is hard to arouse even to sternal rub.   HEENT: Pupils are round and reactive. Sclerae anicteric. Mucous membranes are dry. Oropharynx is clear. She has a nonrebreather face mask on.  CARDIOVASCULAR: Tachycardia. No murmurs, gallops, or rubs. PMI is nondisplaced.  LUNGS: Anteriorly clear to auscultation without crackles, rales, rhonchi, or wheezing. Normal to percussion.   ABDOMEN: Bowel sounds positive, nontender and nondistended. No hepatosplenomegaly.   EXTREMITIES: No clubbing, cyanosis or edema.   SKIN: No rash or lesions.   NEURO: Unable to do a neurologic examination as the patient is sedated and hardly wakes up to sternal rub.  LABS/STUDIES: Sodium 145, potassium 3.7, chloride 110, bicarbonate 27, BUN 14, creatinine 0.90, glucose 89, total protein 6.6, albumin 3.3, bilirubin 0.3, alkaline phosphatase 85, AST 18, and AST 16. TSH 0.51. Urine toxicology positive for benzos, marijuana, and opiates.   White blood cells 9.2, hemoglobin 13, hematocrit 40.4, and platelets 185.   Urinalysis shows no leukocyte esterase or nitrites.   Tylenol level 23. Aspirin level 3.9. Pregnancy test is negative.   EKG: Normal sinus rhythm. No ST elevations or depression.   ASSESSMENT AND PLAN: This is a 44 year old female who is status post overdose with what appears to be Soma, Klonopin, and Neurontin.  1. Overdose on Soma, Klonopin, and Neurontin. The patient will need Critical Care Unit as well as a sitter and a psych consult. We will continue supportive care.  2. SIRS secondary to overdose with hypotension.  We will provide IV fluids and monitor closely.  3. Depression and anxiety. We will consult psychiatry. The patient will have a sitter.  4. History of hypertension. We will go ahead and hold metoprolol.  5. Hyperlipidemia. We will hold Crestor for now.  CODE STATUS:  As far as I know, the patient is FULL CODE status.  TIME SPENT: Approximately 55 minutes. ____________________________ Janyth Contes. Juliene Pina, MD spm:slb D: 07/15/2012 15:12:34 ET     T: 07/15/2012 16:07:34 ET       JOB#: [id number] cc: Audryana Hockenberry P. Juliene Pina, MD, <Dictator> Meindert A. Lacie Scotts, MD Janyth Contes Lakynn Halvorsen MD ELECTRONICALLY SIGNED 07/15/2012 18:32

## 2015-04-19 NOTE — Consult Note (Signed)
Brief Consult Note: Diagnosis: sedative abuse.   Patient was seen by consultant.   Consult note dictated.   Orders entered.   Comments: Psychiatry: Patient abusing multiple prescription drugs. No evidence of intentional suicidality. Affect euthymic. No sign of psychosis. No longer commitable. Patient educated about dangers of mixing substances. Will dc the IVC and she can be discharged with rec to cut down on prescription meds and review them with PCP.  Electronic Signatures: Clapacs, Jackquline DenmarkJohn T (MD)  (Signed 28-Sep-15 12:41)  Authored: Brief Consult Note   Last Updated: 28-Sep-15 12:41 by Audery Amellapacs, John T (MD)

## 2015-04-19 NOTE — H&P (Signed)
PATIENT NAME:  Destiny Simmons, Destiny Simmons MR#:  [id number] DATE OF Volanda NapoleonBIRTH:  1971-02-25  DATE OF ADMISSION:  09/22/2014  REFERRING PHYSICIAN: Loraine LericheMark R. Fanny BienQuale, MD  PRIMARY CARE PHYSICIAN: Meindert A. Lacie ScottsNiemeyer, MD  CHIEF COMPLAINT: Altered mental status.   HISTORY OF PRESENT ILLNESS: A 44 year old Caucasian female with a history of polysubstance abuse as well as a history of hypertension, presenting with altered mental status. The patient herself was unable to provide any meaningful information at this time given mental status and medical condition. However, her history, per her husband, she has been taking Soma, Klonopin, and tramadol as well as doses of Xanax; all of these are unknown doses and amount. He noted today that she was more lethargic, drowsy, as well as confused. She was brought to the hospital for further workup and evaluation. He stated that she was in her usual state of health. No fevers, chills, or further symptomatology. Currently, in the Emergency Department she has been altered but normal vital signs and labs thus far.  REVIEW OF SYSTEMS: Unable to obtain given the patient's mental status and medical condition.   PAST MEDICAL HISTORY: Per medical record, including sleep apnea, degenerative joint disease, polysubstance abuse, borderline histrionic schizophrenic disorder, depression, anxiety not otherwise specified, hyperlipidemia, hypertension.   SOCIAL HISTORY: Positive tobacco as well as marijuana and polysubstance usage. No IV drug abuse documented. Unknown if drinks any alcohol.   FAMILY HISTORY: Unobtainable at this time given patient's mental status and medical condition.   ALLERGIES: CIPRO, CODEINE, DARVOCET, DEMEROL, KEFLEX, LAMICTAL, PENICILLIN, VICODIN, RUBBER GLOVES.   HOME MEDICATIONS: Unable to obtain at this time given patient's mental status and medical condition.   PHYSICAL EXAMINATION:  VITAL SIGNS: Temperature 98.3, heart rate 99, respirations 18, blood pressure 98/77,  saturating 99% on room air. Weight 80.7 kg, BMI 32.6.  GENERAL: Well-nourished, well-developed, Caucasian female, currently in minimal distress given mental status.  HEAD: Normocephalic, atraumatic.  EYES: Pupils equal, round, reactive to light. Extraocular muscles intact. No scleral icterus.  MOUTH: Dry mucosal membrane. Dentition intact. No abscess noted. EARS, NOSE, THROAT: Clear without exudates. No external lesions.  NECK: Supple. No thyromegaly. No nodules. No JVD.  PULMONARY: Clear to auscultation bilaterally without wheezes, rubs, or rhonchi. No use of accessory muscles. Good respiratory effort. CHEST: Nontender to palpation.  CARDIOVASCULAR: S1, S2. Regular rate and rhythm. No murmurs, rubs, or gallops. No edema. Pedal pulses 2+ bilaterally.  GASTROINTESTINAL: Soft, nontender, nondistended. No masses. Positive bowel sounds. No hepatosplenomegaly. MUSCULOSKELETAL: No swelling, clubbing, or edema. Range of motion full in all extremities.  NEUROLOGICAL: Unable to fully assess given patient's mental status. She can mumble incoherently; however, does not follow any commands or provide any meaningful information.  SKIN: No ulceration, lesions, rashes, or cyanosis. Skin warm, dry. Turgor intact.  PSYCHIATRIC: Unable to fully assess given patient's mental status and medical condition. Currently she is somnolent. She is arousable to verbal stimuli; however, then falls promptly back to sleep without providing any meaningful information or the ability to follow any commands. Insight and judgment at this time appear to be poor.   LABORATORY DATA: Sodium 136, potassium 3.4, chloride 103, bicarbonate 28, BUN 13, creatinine 0.85, glucose 78. LFTs: Alkaline phosphatase of 128, otherwise within normal limits. Urine drug screen positive for amphetamines, benzodiazepines, cannabinoids, as well as opiates. WBC 10.8, hemoglobin 13.7, platelets of 216,000. Urinalysis negative for evidence of infection.  Acetaminophen less than 2. Salicylates trending downward from 4.4 to 3.8. CT, head, performed, no acute intracranial process.  ASSESSMENT AND PLAN: A 44 year old Caucasian female with a history of polysubstance abuse presenting with altered mental status. 1.  Encephalopathy likely due to drug abuse. Provide supportive care. Psychiatric consult. Initiate [facility] Withdrawal Assessment protocol.  2.  Hypokalemia. Replace potassium to goal of 4 to 5. 3.  Venous thromboembolism prophylaxis with heparin subcutaneously.  CODE STATUS: Patient is full code.  TIME SPENT: 45 minutes.   ____________________________ Cletis Athens. Kayceon Oki, MD dkh:ST D: 09/22/2014 23:06:20 ET T: 09/22/2014 23:37:21 ET JOB#: [id number]  cc: Cletis Athens. Journii Nierman, MD, <Dictator> Cheridan Kibler Synetta Shadow MD ELECTRONICALLY SIGNED 09/23/2014 20:24

## 2015-04-19 NOTE — Discharge Summary (Signed)
PATIENT NAME:  Destiny Simmons, Destiny Simmons MR#:  [id number] DATE OF BIRTH:  01/07/1971  DATE OF ADMISSION:  09/22/2014 DATE OF DISCHARGE:  09/23/2014  PRIMARY CARE PHYSICIAN: Evelene CroonMeindert Niemeyer, MD  DISCHARGE DIAGNOSIS: Acute encephalopathy, likely due to drug abuse.   CONDITION: Stable.   CODE STATUS: FULL code.   HOME MEDICATIONS: Please refer to the medication reconciliation list.   DISCHARGE DIET: Regular diet.   DISCHARGE ACTIVITY: As tolerated.   FOLLOW-UP CARE: Follow up with PCP within 1 to 2 weeks.   REASON FOR ADMISSION: Altered mental status.   HOSPITAL COURSE: The patient is a 44 year old Caucasian female with history of polysubstance abuse and hypertension presenting to the ED with altered mental status. According to the patient's husband, the patient has been taking soma, Klonopin, tramadol and Xanax. The patient was noticed to have lethargy, drowsy, and confusion and then the patient was brought to ED for further evaluation. For detailed history and physical examination, please refer to the admission note dictated by Dr. Clint GuyHower. Laboratory data on admission date showed CAT scan of head showing no acute intracranial abnormality. Laboratory data was unremarkable, except urine drug screen showed positive for benzodiazepine, cannabinoids and amphetamines.   The patient was admitted for acute encephalopathy due to polysubstance abuse. After admission the patient has been treated with IV fluids and now on withdraw assessment protocol. In addition, the patient was on IVC due to polysubstance drug abuse. The patient is alert, awake, and oriented this morning. She denies any depression, anxiety or suicidal ideation. Dr. Toni Amendlapacs evaluated the patient and suggested discontinue IVC and he suggests the patient needs to see her primary care physician for adjustment of her medication dose. The patient is clinically stable and will be discharged to home today. I discussed the patient's discharge plan with  the patient, nurse, and case manager.   TIME SPENT: About 36 minutes.   ____________________________ Shaune PollackQing Manha Amato, MD qc:sb D: 09/23/2014 13:17:30 ET T: 09/23/2014 14:21:29 ET JOB#: [id number]  cc: Shaune PollackQing Everlina Gotts, MD, <Dictator> Shaune PollackQING Tashea Othman MD ELECTRONICALLY SIGNED 09/23/2014 15:31

## 2015-04-19 NOTE — Consult Note (Signed)
PATIENT NAME:  Destiny Simmons, Destiny C MR#:  [id number] DATE OF BIRTH:  05-03-1971  DATE OF CONSULTATION:  09/23/2014  CONSULTING PHYSICIAN:  Audery AmelJohn T. Taleigh Gero, MD  IDENTIFYING INFORMATION AND REASON FOR CONSULTATION:  A 44 year old woman with a past history of polysubstance abuse who came into the hospital over the weekend with altered mental status.  The patient was found by family with altered mental state, was unable to give a useful history on admission. Was thought to probably have overdosed on prescription medicine.  Was admitted to the medicine service.  On my evaluation today, the patient's chief complaint is "I didn't overdose on purpose."  She tells me that her mood has been reasonably good. Denies feeling depressed. Says that she has some chronic difficulty sleeping. No auditory or visual hallucinations.  No panic attacks.  She says that she suspects that she accidentally overdosed on her medicine and her rationale for this is that she had been off it for a while and went back on to it full-strength.  The whole story is a little flimsy.  She is not completely clear about which medicine she has been taking or all of the exact dosages.  She claims that she has not bought any prescription medicines off the street and has not been overusing any of her drugs.  She does admit that she uses marijuana occasionally.  She says that her chief complaint is her chronic pain for which she takes Soma and clonazepam from her primary care doctor.  When she came into the hospital, she had a drug screen that was positive for opiates, benzodiazepines amphetamines, and marijuana.  She denies using any kind of amphetamines.  Says that she takes tramadol and clonazepam and Soma.  The husband tells me in private that he suspects she has bought some Xanax off the street as well.  The patient absolutely denied using any Xanax.   PAST PSYCHIATRIC HISTORY:  The patient has been treated for somewhat nonspecific anxiety symptoms and  chronic pain primarily by her primary care doctor, Dr. Lacie ScottsNiemeyer. According to the controlled substance database, she last filled a prescription for him back in the early part of this summer.  She claims that she went back to the office and got one of his nurses or PAs to write her a new prescription that she just filled on Friday.  The patient denies any recent suicidality.  She does have a past suicide attempt, but she said it has been a long time since then.   SOCIAL HISTORY:  The patient lives at home with her husband and sister.  Adding the sister and her brother-in-law at home has been stressful for her.   PAST MEDICAL HISTORY: Chronic pain, apparently fibromyalgia. Chronic obstructive pulmonary disease.   FAMILY HISTORY: Positive for anxiety and depression.   REVIEW OF SYSTEMS:  Currently, mood is feeling good, not depressed.  No suicidal or homicidal ideation. No hallucinations. Mild chronic pain under okay control.  No acute shortness of breath.  The rest of the whole 9-point review of systems is negative.   CURRENT MEDICATIONS: Mobic, gabapentin, promethazine, inhalers, Soma, clonazepam and tramadol.   ALLERGIES: CIPROFLOXACIN AND CODEINE, DARVOCET, DEMEROL, KEFLEX, LAMICTAL, PENICILLIN, VICODIN, RUBBER GLOVES.    MENTAL STATUS EXAMINATION: Reasonably well-groomed woman, looks her stated age, cooperative with the interview. Sitting up in bed, alert, and oriented, good eye contact. Normal psychomotor activity. Speech normal rate, tone and volume.  Affect euthymic, reactive, appropriate. Mood stated as okay. Thoughts are lucid without  loosening of associations or delusions. Denies auditory or visual hallucinations. Denies suicidal or homicidal ideation. Has reasonably good short and long-term memory. Can recall 3/3 objects immediately, 3/3 objects at 2 minutes.  Judgment and insight are questionable, debatable depending on how honest she is being.   LABORATORY RESULTS: Drug screen on admission  positive for amphetamines, opiates, cannabis, benzodiazepines.  Chemistry panel: Slightly low potassium 3.4. Alkaline phosphatase elevated 128.  Rest of the liver enzymes normal.  TSH normal.  Hematology panel all normal. Glucoses have been running moderately low but not severely so.  EKG unremarkable.  Urinalysis probably contaminated, not obviously infected.   VITAL SIGNS: Currently blood pressure 99/71, pulse 80, never been tachycardic on this admission, temperature 98.2.   ASSESSMENT: A 44 year old woman who evidently was overloaded with medicines, probably mostly sedatives.  The patient is minimizing it and absolutely denies that she intentionally took an overdose and denies that she abuses anything.  In private, her husband tells me that he strongly suspects that she overuses her medicines and has an addiction problem with her sedatives.  The patient was confronted with the possibility of this and denied it.  Saying she only took what was prescribed for her.  At this point, she denies suicidal ideation.  She is lucid and not psychotic. Mood and affect are upbeat.  No evidence of that she is acutely dangerous to herself. The patient no longer meets commitment criteria.   TREATMENT PLAN: The patient can be taken off commitment. The patient was educated about the risks of continued abuse of multiple medicines including sedatives.  Educated about how, especially in combination with her chronic obstructive pulmonary disease, those could be potentially fatal.  Strongly encouraged to discontinue the use of any excessive sedatives and to talk with her primary care doctor about this incident and the possibility of cutting back on some of her medicines rather than continuing on full doses. The patient expressed an understanding of all that and agreed to follow-up as suggested.  At this point, no indication for needed inpatient hospitalization.   The patient will be taken off commitment and she can be released  from the hospital.   DIAGNOSIS, PRINCIPAL AND PRIMARY:  AXIS I: Delirium due to polysubstance intoxication, now resolved.   SECONDARY DIAGNOSES:  AXIS I: Sedative abuse.     ____________________________ Audery Amel, MD jtc:DT D: 09/23/2014 15:15:30 ET T: 09/23/2014 16:00:08 ET JOB#: [id number]  cc: Audery Amel, MD, <Dictator> Audery Amel MD ELECTRONICALLY SIGNED 10/03/2014 10:33

## 2015-05-22 ENCOUNTER — Emergency Department: Payer: Medicare Other

## 2015-05-22 ENCOUNTER — Emergency Department
Admission: EM | Admit: 2015-05-22 | Discharge: 2015-05-22 | Disposition: A | Discharge status: Home / Self Care | Payer: Medicare Other | Attending: Emergency Medicine | Admitting: Emergency Medicine

## 2015-05-22 DIAGNOSIS — R1010 Upper abdominal pain, unspecified: Secondary | ICD-10-CM

## 2015-05-22 DIAGNOSIS — Z880 Allergy status to penicillin: Secondary | ICD-10-CM | POA: Diagnosis not present

## 2015-05-22 DIAGNOSIS — R109 Unspecified abdominal pain: Secondary | ICD-10-CM | POA: Diagnosis present

## 2015-05-22 DIAGNOSIS — Z720 Tobacco use: Secondary | ICD-10-CM | POA: Insufficient documentation

## 2015-05-22 DIAGNOSIS — Q433 Congenital malformations of intestinal fixation: Secondary | ICD-10-CM | POA: Insufficient documentation

## 2015-05-22 HISTORY — DX: Unspecified asthma, uncomplicated: J45.909

## 2015-05-22 LAB — COMPREHENSIVE METABOLIC PANEL
ALK PHOS: 89 U/L (ref 38–126)
ALT: 10 U/L — AB (ref 14–54)
AST: 13 U/L — AB (ref 15–41)
Albumin: 4.3 g/dL (ref 3.5–5.0)
Anion gap: 7 (ref 5–15)
BILIRUBIN TOTAL: 0.3 mg/dL (ref 0.3–1.2)
BUN: 10 mg/dL (ref 6–20)
CALCIUM: 9.3 mg/dL (ref 8.9–10.3)
CO2: 28 mmol/L (ref 22–32)
CREATININE: 1.02 mg/dL — AB (ref 0.44–1.00)
Chloride: 105 mmol/L (ref 101–111)
GFR calc Af Amer: >60 mL/min (ref >60)
GLUCOSE: 93 mg/dL (ref 65–99)
Potassium: 3.8 mmol/L (ref 3.5–5.1)
SODIUM: 140 mmol/L (ref 135–145)
Total Protein: 7.6 g/dL (ref 6.5–8.1)

## 2015-05-22 LAB — CBC WITH DIFFERENTIAL/PLATELET
Basophils Absolute: 0.1 10*3/uL (ref 0–0.1)
Basophils Relative: 1 %
EOS ABS: 0.2 10*3/uL (ref 0–0.7)
EOS PCT: 2 %
HEMATOCRIT: 45.3 % (ref 35.0–47.0)
HEMOGLOBIN: 14.7 g/dL (ref 12.0–16.0)
LYMPHS ABS: 3.5 10*3/uL (ref 1.0–3.6)
Lymphocytes Relative: 33 %
MCH: 28.8 pg (ref 26.0–34.0)
MCHC: 32.4 g/dL (ref 32.0–36.0)
MCV: 88.8 fL (ref 80.0–100.0)
MONOS PCT: 7 %
Monocytes Absolute: 0.7 10*3/uL (ref 0.2–0.9)
NEUTROS ABS: 6 10*3/uL (ref 1.4–6.5)
Neutrophils Relative %: 57 %
Platelets: 200 10*3/uL (ref 150–440)
RBC: 5.1 MIL/uL (ref 3.80–5.20)
RDW: 15.3 % — ABNORMAL HIGH (ref 11.5–14.5)
WBC: 10.6 10*3/uL (ref 3.6–11.0)

## 2015-05-22 LAB — URINALYSIS COMPLETE WITH MICROSCOPIC (ARMC ONLY)
BILIRUBIN URINE: NEGATIVE
GLUCOSE, UA: NEGATIVE mg/dL
Hgb urine dipstick: NEGATIVE
Leukocytes, UA: NEGATIVE
Nitrite: NEGATIVE
PH: 6 (ref 5.0–8.0)
Protein, ur: NEGATIVE mg/dL
Specific Gravity, Urine: 1.028 (ref 1.005–1.030)

## 2015-05-22 LAB — LIPASE, BLOOD: Lipase: 38 U/L (ref 22–51)

## 2015-05-22 MED ORDER — IOHEXOL 240 MG/ML SOLN
25.0000 mL | Freq: Once | INTRAMUSCULAR | Status: DC | PRN
  Filled 2015-05-22: qty 25

## 2015-05-22 MED ORDER — OXYCODONE-ACETAMINOPHEN 5-325 MG PO TABS: 1.0000 | ORAL_TABLET | Freq: Four times a day (QID) | ORAL | Status: DC | PRN

## 2015-05-22 MED ORDER — IOHEXOL 350 MG/ML SOLN
100.0000 mL | Freq: Once | INTRAVENOUS | Status: DC | PRN
  Filled 2015-05-22: qty 100

## 2015-05-22 MED ORDER — IOHEXOL 300 MG/ML  SOLN
100.0000 mL | Freq: Once | INTRAMUSCULAR | Status: DC | PRN
  Filled 2015-05-22: qty 100

## 2015-05-22 MED ORDER — DICYCLOMINE HCL 20 MG PO TABS: 20.0000 mg | ORAL_TABLET | Freq: Three times a day (TID) | ORAL | Status: DC | PRN

## 2015-05-22 NOTE — ED Notes (Signed)
Patient transported to CT 

## 2015-05-22 NOTE — Discharge Instructions (Signed)
Abdominal Pain Many things can cause abdominal pain. Usually, abdominal pain is not caused by a disease and will improve without treatment. It can often be observed and treated at home. Your health care provider will do a physical exam and possibly order blood tests and X-rays to help determine the seriousness of your pain. However, in many cases, more time must pass before a clear cause of the pain can be found. Before that point, your health care provider may not know if you need more testing or further treatment. HOME CARE INSTRUCTIONS  Monitor your abdominal pain for any changes. The following actions may help to alleviate any discomfort you are experiencing:  Only take over-the-counter or prescription medicines as directed by your health care provider.  Do not take laxatives unless directed to do so by your health care provider.  Try a clear liquid diet (broth, tea, or water) as directed by your health care provider. Slowly move to a bland diet as tolerated. SEEK MEDICAL CARE IF:  You have unexplained abdominal pain.  You have abdominal pain associated with nausea or diarrhea.  You have pain when you urinate or have a bowel movement.  You experience abdominal pain that wakes you in the night.  You have abdominal pain that is worsened or improved by eating food.  You have abdominal pain that is worsened with eating fatty foods.  You have a fever. SEEK IMMEDIATE MEDICAL CARE IF:   Your pain does not go away within 2 hours.  You keep throwing up (vomiting).  Your pain is felt only in portions of the abdomen, such as the right side or the left lower portion of the abdomen.  You pass bloody or black tarry stools. MAKE SURE YOU:  Understand these instructions.   Will watch your condition.   Will get help right away if you are not doing well or get worse.  Document Released: 09/22/2005 Document Revised: 12/18/2013 Document Reviewed: 08/22/2013 [facility] Patient Information  2015 [place]. This information is not intended to replace advice given to you by your health care provider. Make sure you discuss any questions you have with your health care provider.  You were prescribed a medication that is potentially sedating. Do not drink alcohol, drive or participate in any other potentially dangerous activities while taking this medication as it may make you sleepy. Do not take this medication with any other sedating medications, either prescription or over-the-counter. If you were prescribed Percocet or Vicodin, do not take these with acetaminophen (Tylenol) as it is already contained within these medications.   Opioid pain medications (or "narcotics") can be habit forming.  Use it as little as possible to achieve adequate pain control.  Do not use or use it with extreme caution if you have a history of opiate abuse or dependence.  If you are on a pain contract with your primary care doctor or a pain specialist, be sure to let them know you were [facility] this [facility].  This medication is intended for your use only - do not give any to anyone else and keep it in a secure place where nobody else, especially children and pets, have access to it.  It will also cause or worsen constipation, so you may want to consider taking an over-the-counter stool softener while you are taking this medication. You were prescribed a medication that is potentially sedating. Do not drink alcohol, drive or participate in any other potentially dangerous activities while  taking this medication as it may make you sleepy. Do not take this medication with any other sedating medications, either prescription or over-the-counter. If you were prescribed Percocet or Vicodin, do not take these with acetaminophen (Tylenol) as it is already contained within these medications.   Opioid pain medications (or "narcotics") can be habit forming.  Use it as  little as possible to achieve adequate pain control.  Do not use or use it with extreme caution if you have a history of opiate abuse or dependence.  If you are on a pain contract with your primary care doctor or a pain specialist, be sure to let them know you were prescribed this medication today from the [facility].  This medication is intended for your use only - do not give any to anyone else and keep it in a secure place where nobody else, especially children and pets, have access to it.  It will also cause or worsen constipation, so you may want to consider taking an over-the-counter stool softener while you are taking this medication.

## 2015-05-22 NOTE — ED Provider Notes (Signed)
[facility] Emergency Department Provider Note  ____________________________________________  Time seen: 7:30 PM  I have reviewed the triage vital signs and the nursing notes.   HISTORY  Chief Complaint Abdominal Pain    HPI Destiny Simmons is a 44 y.o. female who has been suffering from chronic intermittent upper abdominal pain for the past 6 months. She notes that she may go a week without any symptoms, but when it comes on, it is sudden and severe with nausea and vomiting. Does not appear to be related to any food. No trauma. No blood in emesis or stool. No fevers or chills chest pain shortness of breath headache dizziness lightheadedness numbness tingling or weakness.  When the abdominal pain occurs, it is in the upper abdomen bilaterally, sharp, nonradiating, no aggravating or alleviating factors, intermittent lasting approximately 30-60 minutes.  She has seen gastroenterology Dr. Servando Snare who has done an upper and lower endoscopy with findings of only gastritis per the patient's report. She is taking Protonix for this.   Past Medical History  Diagnosis Date  . Sleep apnea   . COPD (chronic obstructive pulmonary disease)   . Asthma     There are no active problems to display for this patient.   Past Surgical History  Procedure Laterality Date  . Abdominal hysterectomy      Current Outpatient Rx  Name  Route  Sig  Dispense  Refill  . dicyclomine (BENTYL) 20 MG tablet   Oral   Take 1 tablet (20 mg total) by mouth 3 (three) times daily as needed for spasms.   30 tablet   0   . HYDROcodone-acetaminophen (NORCO/VICODIN) 5-325 MG per tablet      Take 1-2 tablets by mouth every 6 hours as needed for pain and/or cough.   7 tablet   0   . oxyCODONE-acetaminophen (ROXICET) 5-325 MG per tablet   Oral   Take 1 tablet by mouth every 6 (six) hours as needed for severe pain.   12 tablet   0     Allergies Ciprofloxacin; Keflex; Penicillins; and  Vicodin  No family history on file.  Social History History  Substance Use Topics  . Smoking status: Current Every Day Smoker -- 1.00 packs/day    Types: Cigarettes  . Smokeless tobacco: Not on file  . Alcohol Use: No    Review of Systems  Constitutional: No fever or chills. No weight changes Eyes:No blurry vision or double vision.  ENT: No sore throat. Cardiovascular: No chest pain. Respiratory: No dyspnea or cough. Gastrointestinal: Intermittent abdominal pain with vomiting.Marland Kitchen  No BRBPR or melena. Genitourinary: Negative for dysuria, urinary retention, bloody urine, or difficulty urinating. Musculoskeletal: Negative for back pain. No joint swelling or pain. Skin: Negative for rash. Neurological: Negative for headaches, focal weakness or numbness. Psychiatric:No anxiety or depression.   Endocrine:No hot/cold intolerance, changes in energy, or sleep difficulty.  10-point ROS otherwise negative.  ____________________________________________   PHYSICAL EXAM:  VITAL SIGNS: ED Triage Vitals  Enc Vitals Group     BP 05/22/15 1734 111/88 mmHg     Pulse Rate 05/22/15 1734 95     Resp 05/22/15 1734 15     Temp 05/22/15 1734 97.9 F (36.6 C)     Temp Source 05/22/15 1734 Oral     SpO2 05/22/15 1734 100 %     Weight 05/22/15 1734 165 lb (74.844 kg)     Height 05/22/15 1734  (1.575 m)     Head Cir --  Peak Flow --      Pain Score 05/22/15 1737 6     Pain Loc --      Pain Edu? --      Excl. in GC? --      Constitutional: Alert and oriented. Well appearing and in no distress. Eyes: No scleral icterus. No conjunctival pallor. PERRL. EOMI ENT   Head: Normocephalic and atraumatic.   Nose: No congestion/rhinnorhea. No septal hematoma   Mouth/Throat: MMM, no pharyngeal erythema. No peritonsillar mass. No uvula shift.   Neck: No stridor. No SubQ emphysema. No meningismus. Hematological/Lymphatic/Immunilogical: No cervical  lymphadenopathy. Cardiovascular: RRR. Normal and symmetric distal pulses are present in all extremities. No murmurs, rubs, or gallops. Respiratory: Normal respiratory effort without tachypnea nor retractions. Breath sounds are clear and equal bilaterally. No wheezes/rales/rhonchi. Gastrointestinal: Soft with mild epigastric tenderness. No distention. There is no CVA tenderness.  No rebound, rigidity, or guarding. Genitourinary: deferred Musculoskeletal: Nontender with normal range of motion in all extremities. No joint effusions.  No lower extremity tenderness.  No edema. Neurologic:   Normal speech and language.  CN 2-10 normal. Motor grossly intact. No pronator drift.  Normal gait. No gross focal neurologic deficits are appreciated.  Skin:  Skin is warm, dry and intact. No rash noted.  No petechiae, purpura, or bullae. Psychiatric: Mood and affect are normal. Speech and behavior are normal. Patient exhibits appropriate insight and judgment.  ____________________________________________    LABS (pertinent positives/negatives) (all labs ordered are listed, but only abnormal results are displayed) Labs Reviewed  CBC WITH DIFFERENTIAL/PLATELET - Abnormal; Notable for the following:    RDW 15.3 (*)    All other components within normal limits  COMPREHENSIVE METABOLIC PANEL - Abnormal; Notable for the following:    Creatinine, Ser 1.02 (*)    AST 13 (*)    ALT 10 (*)    All other components within normal limits  URINALYSIS COMPLETEWITH MICROSCOPIC ([facility] ONLY) - Abnormal; Notable for the following:    Color, Urine YELLOW (*)    APPearance HAZY (*)    Ketones, ur TRACE (*)    Bacteria, UA RARE (*)    Squamous Epithelial / LPF 6-30 (*)    All other components within normal limits  LIPASE, BLOOD   ____________________________________________   EKG    ____________________________________________    RADIOLOGY  CT abdomen and pelvis significant for malrotation of the intestine  with abnormal position of the duodenum and cecum  ____________________________________________   PROCEDURES  ____________________________________________   INITIAL IMPRESSION / ASSESSMENT AND PLAN / ED COURSE  Pertinent labs & imaging results that were available during my care of the patient were reviewed by me and considered in my medical decision making (see chart for details).  Patient well appearing no acute distress overall unimpressive exam. Lab work so far unremarkable. Due to the chronicity of her symptoms, the patient was referred to the ED for a CT scan with the concern I think for renal colic. We will proceed with a CT scan to evaluate for any occult pathology that is so far evaded diagnostic workup ----------------------------------------- 8:39 PM on 05/22/2015 -----------------------------------------  CT reviewed. We'll start the patient on Bentyl and when necessary Percocet for symptom control. Patient has follow-up appointment with GI in 1 week, so we will defer to GI as far as selection of appropriate specialty referral that may be able to address her symptoms which are likely spasm or vascular in nature with the malrotation. No evidence of obstruction or ischemia at  this time. ____________________________________________   FINAL CLINICAL IMPRESSION(S) / ED DIAGNOSES  Final diagnoses:  Malrotation of intestine  Pain of upper abdomen      Sharman CheekPhillip Dadrian Ballantine, MD 05/22/15 2040

## 2015-05-22 NOTE — ED Notes (Signed)
Patient has been experiencing upper abdominal pain and n/v for 6 months. Patient has had a colonoscopy and endoscopy without any definitive diagnosis. Patient states her doctor told her to come to the ED for a CT scan.

## 2015-05-29 ENCOUNTER — Ambulatory Visit (INDEPENDENT_AMBULATORY_CARE_PROVIDER_SITE_OTHER): Payer: Medicare Other | Admitting: Urgent Care

## 2015-05-29 ENCOUNTER — Encounter: Payer: Self-pay | Admitting: Urgent Care

## 2015-05-29 ENCOUNTER — Ambulatory Visit: Payer: Self-pay | Admitting: Urgent Care

## 2015-05-29 VITALS — BP 130/85 | HR 72 | Temp 98.3°F | Ht 62.0 in | Wt 160.0 lb

## 2015-05-29 DIAGNOSIS — G43A0 Cyclical vomiting, not intractable: Secondary | ICD-10-CM | POA: Diagnosis not present

## 2015-05-29 DIAGNOSIS — K589 Irritable bowel syndrome without diarrhea: Secondary | ICD-10-CM | POA: Diagnosis not present

## 2015-05-29 DIAGNOSIS — Q433 Congenital malformations of intestinal fixation: Secondary | ICD-10-CM

## 2015-05-29 DIAGNOSIS — R1115 Cyclical vomiting syndrome unrelated to migraine: Secondary | ICD-10-CM | POA: Insufficient documentation

## 2015-05-29 MED ORDER — POLYETHYLENE GLYCOL 3350 17 GM/SCOOP PO POWD: 17.0000 g | Freq: Every day | ORAL | Status: DC

## 2015-05-29 MED ORDER — DICYCLOMINE HCL 20 MG PO TABS: 20.0000 mg | ORAL_TABLET | Freq: Three times a day (TID) | ORAL | Status: DC

## 2015-05-29 NOTE — Progress Notes (Signed)
Primary Care Physician: Corky Downs, MD Primary Gastroenterologist:  Dr Servando Snare  Chief Complaint  Patient presents with  . Abdominal Pain  . Vomiting   HPI: Destiny Simmons is a 44 y.o. female here for follow-up with intermittent abdominal pain, nausea and vomiting. She also has chronic constipation. EGD and colonoscopy by Dr. Servando Snare 04/02/15 showed hyperplastic polyps which were removed from the sigmoid colon, internal hemorrhoids, and erythematous duodenopathy.  No small bowel biopsies were obtained. She was doing well until she had an episode which led her to the ER recently. While in the ER she had a CT scan of abdomen and pelvis with IV and oral contrast that showed Intestinal malrotation without associated obstruction, significant ileus, free air, or abscess.  Normal appendix  2.5 cm left ovarian cyst  Prior hysterectomy  No other acute intra-abdominal or pelvic process.  Bentyl has helped.  Last emesis was the day she presented to ER.  After she eats she cramps in upper abdomen, pain 6/10, followed by nausea & vomiting. She can go up to 3-7 days without a BM. She has tried stool softeners for her constipation but no other medications.  Denies fever or chills. Hx of marijuana use.   Current Outpatient Prescriptions  Medication Sig Dispense Refill  . dicyclomine (BENTYL) 20 MG tablet Take 1 tablet (20 mg total) by mouth 4 (four) times daily -  before meals and at bedtime. 30 tablet 0  . HYDROcodone-acetaminophen (NORCO/VICODIN) 5-325 MG per tablet Take 1-2 tablets by mouth every 6 hours as needed for pain and/or cough. 7 tablet 0  . oxyCODONE-acetaminophen (ROXICET) 5-325 MG per tablet Take 1 tablet by mouth every 6 (six) hours as needed for severe pain. 12 tablet 0  . polyethylene glycol powder (MIRALAX) powder Take 17 g by mouth daily. (Patient not taking: Reported on 05/29/2015) 527 g 11   No current facility-administered medications for this visit.    Allergies as of 05/29/2015 - Review  Complete 05/29/2015  Allergen Reaction Noted  . Ciprofloxacin Hives and Itching 05/22/2015  . Penicillins  03/08/2015  . Vicodin [hydrocodone-acetaminophen] Hives, Itching, Nausea Only, and Nausea And Vomiting 05/22/2015  . Keflex [cephalexin] Hives, Itching, and Rash 05/22/2015    Review of Systems: Gen: Denies any fatigue, weakness, malaise ENT: Negative for hoarseness, difficulty swallowing , nasal congestion CV: Denies chest pain, angina, palpitations, syncope, orthopnea, PND, peripheral edema, and claudication. Resp: Denies dyspnea at rest, dyspnea with exercise, cough, sputum, wheezing, coughing up blood, and pleurisy. GI: See HPI GU:  Negative for dysuria, hematuria, urinary incontinence, urinary frequency, nocturnal urination.  Endo: Negative for unusual weight change or sweats Derm: Denies jaundice, rash, itching, or unhealing ulcers.  Psych: Denies depression, anxiety, memory loss, suicidal ideation, hallucinations, paranoia, and confusion. Heme: Denies bruising, bleeding, and enlarged lymph nodes.   Physical Examination:  BP 130/85 mmHg  Pulse 72  Temp(Src) 98.3 F (36.8 C) (Oral)  Ht  (1.575 m)  Wt 72.576 kg (160 lb)  BMI 29.26 kg/m2 Body mass index is 29.26 kg/(m^2). No LMP recorded. Patient has had a hysterectomy. General:   Alert,  Well-developed, well-nourished, pleasant and cooperative in NAD, accompanied by her husband Head:  Normocephalic and atraumatic. Eyes:  Sclera clear, no icterus.   Conjunctiva pink. Mouth:  No deformity or lesions.  Oropharynx pink & moist. Neck:  Supple; no masses or thyromegaly. Heart:  Regular rate and rhythm; no murmurs, clicks, rubs,  or gallops. Abdomen:   Normal bowel sounds.  Soft,  nontender and nondistended. No masses, hepatosplenomegaly or hernias noted. No guarding or rebound tenderness.   Msk:  Symmetrical without gross deformities. Normal posture. Pulses:  Normal pulses noted. Extremities:  Without clubbing or  edema. Neurologic:  Alert and  oriented x3.  grossly normal neurologically. Skin:  Intact without significant lesions or rashes. Cervical Nodes:  No significant cervical adenopathy. Psych:  Alert and cooperative. Normal mood and affect.

## 2015-05-29 NOTE — Patient Instructions (Signed)
SBFT-we will call you with results To ER if severe pain Use bentyl as needed for pain Use miralax 17 grams nightly for constipation

## 2015-05-29 NOTE — Assessment & Plan Note (Signed)
He has IBS that is constipation predominant. Will start MiraLAX. Colonoscopy was reassuring.

## 2015-05-29 NOTE — Assessment & Plan Note (Signed)
Small bowel malrotation is seen on CT and she has symptoms of intermittent severe pain, nausea and vomiting.  CT suggests that this malrotation has been present since at least 2011.  It is possible she could be having intermittent obstruction secondary to SB malrotation. Upper GI with small bowel follow-through to further evaluate. If this is the case, she will need follow-up a tertiary care center and she prefers [facility].  It is also possible that this could be a congenital malrotation that is asymptomatic.  Further recommendations pending small bowel follow-through.

## 2015-05-29 NOTE — Addendum Note (Signed)
Addended by: Adela PortsBONICHE, Kimaya Whitlatch on: 05/29/2015 04:03 PM   Modules accepted: Level of Service

## 2015-05-29 NOTE — Assessment & Plan Note (Signed)
See Intestinal malrotation

## 2015-05-30 MED ORDER — DICYCLOMINE HCL 20 MG PO TABS: 20.0000 mg | ORAL_TABLET | Freq: Three times a day (TID) | ORAL | Status: DC

## 2015-05-30 NOTE — Addendum Note (Signed)
Addended by: Arty BaumgartnerSTRUNK, Dow Blahnik G on: 05/30/2015 08:11 AM   Modules accepted: Orders

## 2015-06-04 ENCOUNTER — Telehealth: Payer: Self-pay | Admitting: Urgent Care

## 2015-06-04 ENCOUNTER — Ambulatory Visit
Admission: RE | Admit: 2015-06-04 | Discharge: 2015-06-04 | Disposition: A | Discharge status: Home / Self Care | Payer: Medicare Other | Source: Ambulatory Visit | Attending: Urgent Care | Admitting: Urgent Care

## 2015-06-04 DIAGNOSIS — Q433 Congenital malformations of intestinal fixation: Secondary | ICD-10-CM | POA: Diagnosis present

## 2015-06-04 NOTE — Telephone Encounter (Signed)
Discuss results small bowel follow-through with patient. Referral to [facility] surgery per patient request for chronic intestinal malrotation without obstruction

## 2015-06-09 ENCOUNTER — Emergency Department
Admission: EM | Admit: 2015-06-09 | Discharge: 2015-06-09 | Disposition: A | Discharge status: Home / Self Care | Payer: Medicare Other | Attending: Emergency Medicine | Admitting: Emergency Medicine

## 2015-06-09 ENCOUNTER — Emergency Department: Payer: Medicare Other

## 2015-06-09 ENCOUNTER — Encounter: Payer: Self-pay | Admitting: Emergency Medicine

## 2015-06-09 DIAGNOSIS — Z880 Allergy status to penicillin: Secondary | ICD-10-CM | POA: Insufficient documentation

## 2015-06-09 DIAGNOSIS — G8929 Other chronic pain: Secondary | ICD-10-CM | POA: Diagnosis not present

## 2015-06-09 DIAGNOSIS — R109 Unspecified abdominal pain: Secondary | ICD-10-CM | POA: Diagnosis present

## 2015-06-09 DIAGNOSIS — R1012 Left upper quadrant pain: Secondary | ICD-10-CM | POA: Diagnosis not present

## 2015-06-09 DIAGNOSIS — Z79899 Other long term (current) drug therapy: Secondary | ICD-10-CM | POA: Insufficient documentation

## 2015-06-09 HISTORY — DX: Congenital malformations of intestinal fixation: Q43.3

## 2015-06-09 LAB — COMPREHENSIVE METABOLIC PANEL
ALT: 8 U/L — ABNORMAL LOW (ref 14–54)
ANION GAP: 7 (ref 5–15)
AST: 13 U/L — ABNORMAL LOW (ref 15–41)
Albumin: 3.6 g/dL (ref 3.5–5.0)
Alkaline Phosphatase: 92 U/L (ref 38–126)
BUN: 13 mg/dL (ref 6–20)
CO2: 26 mmol/L (ref 22–32)
Calcium: 8.5 mg/dL — ABNORMAL LOW (ref 8.9–10.3)
Chloride: 108 mmol/L (ref 101–111)
Creatinine, Ser: 0.76 mg/dL (ref 0.44–1.00)
GFR calc non Af Amer: >60 mL/min (ref >60)
Glucose, Bld: 118 mg/dL — ABNORMAL HIGH (ref 65–99)
Potassium: 3.8 mmol/L (ref 3.5–5.1)
Sodium: 141 mmol/L (ref 135–145)
Total Protein: 6.3 g/dL — ABNORMAL LOW (ref 6.5–8.1)

## 2015-06-09 LAB — CBC WITH DIFFERENTIAL/PLATELET
Basophils Absolute: 0.1 10*3/uL (ref 0–0.1)
Basophils Relative: 1 %
Eosinophils Absolute: 0.3 10*3/uL (ref 0–0.7)
Eosinophils Relative: 2 %
HCT: 45.1 % (ref 35.0–47.0)
Hemoglobin: 14.6 g/dL (ref 12.0–16.0)
LYMPHS ABS: 3.6 10*3/uL (ref 1.0–3.6)
LYMPHS PCT: 26 %
MCH: 29.2 pg (ref 26.0–34.0)
MCHC: 32.4 g/dL (ref 32.0–36.0)
MCV: 90 fL (ref 80.0–100.0)
Monocytes Absolute: 1 10*3/uL — ABNORMAL HIGH (ref 0.2–0.9)
Monocytes Relative: 7 %
NEUTROS ABS: 9 10*3/uL — AB (ref 1.4–6.5)
NEUTROS PCT: 64 %
PLATELETS: 190 10*3/uL (ref 150–440)
RBC: 5.02 MIL/uL (ref 3.80–5.20)
RDW: 15.2 % — ABNORMAL HIGH (ref 11.5–14.5)
WBC: 14 10*3/uL — ABNORMAL HIGH (ref 3.6–11.0)

## 2015-06-09 LAB — URINALYSIS COMPLETE WITH MICROSCOPIC (ARMC ONLY)
Bilirubin Urine: NEGATIVE
Glucose, UA: NEGATIVE mg/dL
Hgb urine dipstick: NEGATIVE
Ketones, ur: NEGATIVE mg/dL
Leukocytes, UA: NEGATIVE
Nitrite: NEGATIVE
PH: 5 (ref 5.0–8.0)
PROTEIN: NEGATIVE mg/dL
Specific Gravity, Urine: 1.02 (ref 1.005–1.030)

## 2015-06-09 MED ORDER — MORPHINE SULFATE 4 MG/ML IJ SOLN
4.0000 mg | Freq: Once | INTRAMUSCULAR | Status: AC
  Administered 2015-06-09: 4 mg via INTRAVENOUS

## 2015-06-09 MED ORDER — ONDANSETRON HCL 4 MG/2ML IJ SOLN
4.0000 mg | Freq: Once | INTRAMUSCULAR | Status: AC
  Administered 2015-06-09: 4 mg via INTRAVENOUS

## 2015-06-09 MED ORDER — IOHEXOL 300 MG/ML  SOLN
100.0000 mL | Freq: Once | INTRAMUSCULAR | Status: AC | PRN
  Administered 2015-06-09: 100 mL via INTRAVENOUS

## 2015-06-09 MED ORDER — MORPHINE SULFATE 4 MG/ML IJ SOLN
INTRAMUSCULAR | Status: AC
  Administered 2015-06-09: 4 mg via INTRAVENOUS
  Filled 2015-06-09: qty 1

## 2015-06-09 MED ORDER — ONDANSETRON HCL 4 MG/2ML IJ SOLN
INTRAMUSCULAR | Status: AC
  Administered 2015-06-09: 4 mg via INTRAVENOUS
  Filled 2015-06-09: qty 2

## 2015-06-09 MED ORDER — TRAMADOL HCL 50 MG PO TABS: 50.0000 mg | ORAL_TABLET | Freq: Four times a day (QID) | ORAL | Status: DC | PRN

## 2015-06-09 MED ORDER — IOHEXOL 240 MG/ML SOLN
25.0000 mL | Freq: Once | INTRAMUSCULAR | Status: AC | PRN
  Administered 2015-06-09: 25 mL via ORAL

## 2015-06-09 NOTE — ED Notes (Signed)
Pt signed on E signature but it locked up the computer, I had to call IT.

## 2015-06-09 NOTE — ED Notes (Signed)
Pt presents to ER wrapped in her own blanket. Pt states "I'm here for the same thing I was last time." Pt states generalized abd pain.

## 2015-06-09 NOTE — ED Notes (Signed)
Patient returned from CT

## 2015-06-09 NOTE — ED Notes (Signed)
Patient transported to CT 

## 2015-06-09 NOTE — ED Provider Notes (Addendum)
  Physical Exam  BP 116/94 mmHg  Pulse 85  Temp(Src) 98 F (36.7 C) (Oral)  Resp 18  Ht 5\' 2"  (1.575 m)  Wt 160 lb (72.576 kg)  BMI 29.26 kg/m2  SpO2 100%  Physical Exam Patient resting comfortably and denying any pain at this time. Says is working with her primary care doctor to establish follow-up with gastroenterology. Says that we'll give her primary care doctor or call today to follow up with where things read in the process of being referred to gastroenterology. ED Course  Procedures  MDM Radiology results discussed with the patient including the ovarian and likely renal cysts. Patient is well-appearing at this time and will be discharged home.      Myrna Blazer, MD 06/09/15 [phone]  Patient says has tolerated tramadol without issue in the past.  Myrna Blazer, MD 06/09/15 [phone]

## 2015-06-09 NOTE — ED Provider Notes (Signed)
[facility] Emergency Department Provider Note  ____________________________________________  Time seen: Approximately 6:43 AM  I have reviewed the triage vital signs and the nursing notes.   HISTORY  Chief Complaint Abdominal Pain    HPI Destiny Simmons is a 44 y.o. female was a history of chronic abdominal pain who comes in with acute on chronic abdominal pain tonight. The patient reports that she was told she had malrotation of her intestines when she was seen here recently. She reports that her abdominal pain flare started around 1800 and around 2:30 she went to the bathroom and had a bowel movement. The patient reports that the pain was severe at that time. Her entire abdomen was painful. She reports that she has not taken anything for the pain is Tylenol and ibuprofen did not help with the pain. The patient reports that she has been following with Dr. Servando Snare who her that they will refer her to [facility] or [facility] for a specialist and possibly surgery. The patient reports that she has not heard anything back. She reports that the pain is currently a 6 out of 10 in intensity. She reports that her stomach feels tired after having all of these difficulties.The patient denies any vomiting and has had a bowel movement.   Past Medical History  Diagnosis Date  . Sleep apnea   . COPD (chronic obstructive pulmonary disease)   . Asthma   . Malrotation of intestine     Patient Active Problem List   Diagnosis Date Noted  . Intestinal malrotation 05/29/2015  . Non-intractable cyclical vomiting with nausea 05/29/2015  . IBS (irritable bowel syndrome) 05/29/2015    Past Surgical History  Procedure Laterality Date  . Abdominal hysterectomy    . Colonoscopy w/ biopsies  04/02/2015    2 sessile polyps sigmoid colon  . Esophagogastroduodenoscopy endoscopy  04/02/2015    localized mildly erythomatous mucosa w/o active bleeding found in duodenal buld    Current Outpatient Rx   Name  Route  Sig  Dispense  Refill  . dicyclomine (BENTYL) 20 MG tablet   Oral   Take 1 tablet (20 mg total) by mouth 4 (four) times daily -  before meals and at bedtime.   30 tablet   0   . omeprazole (PRILOSEC) 40 MG capsule   Oral   Take 40 mg by mouth daily.           Allergies Ciprofloxacin; Penicillins; Vicodin; and Keflex  Family History  Problem Relation Age of Onset  . Diabetes Father   . Cancer Paternal Aunt     unknown cancer  . Cancer Paternal Uncle     unknown cancer  . Heart disease Paternal Grandfather     Social History History  Substance Use Topics  . Smoking status: Current Every Day Smoker -- 1.00 packs/day    Types: Cigarettes  . Smokeless tobacco: Not on file  . Alcohol Use: No    Review of Systems Constitutional: No fever/chills Eyes: No visual changes. ENT: No sore throat. Cardiovascular: Denies chest pain. Respiratory: Denies shortness of breath. Gastrointestinal: Abdominal pain, nausea, vomiting Genitourinary: Negative for dysuria. Musculoskeletal: back pain. Skin: Negative for rash. Neurological: Negative for headaches, 10-point ROS otherwise negative.  ____________________________________________   PHYSICAL EXAM:  VITAL SIGNS: ED Triage Vitals  Enc Vitals Group     BP 06/09/15 0412 130/90 mmHg     Pulse Rate 06/09/15 0412 99     Resp 06/09/15 0412 20  Temp 06/09/15 0412 98 F (36.7 C)     Temp Source 06/09/15 0412 Oral     SpO2 06/09/15 0412 99 %     Weight 06/09/15 0412 160 lb (72.576 kg)     Height 06/09/15 0412 5\' 2"  (1.575 m)     Head Cir --      Peak Flow --      Pain Score 06/09/15 0413 7     Pain Loc --      Pain Edu? --      Excl. in GC? --     Constitutional: Alert and oriented. Well appearing and in moderate distress. Eyes: Conjunctivae are normal. PERRL. EOMI. Head: Atraumatic. Nose: No congestion/rhinnorhea. Mouth/Throat: Mucous membranes are moist.  Oropharynx non-erythematous. Cardiovascular:  Normal rate, regular rhythm. Grossly normal heart sounds.  Good peripheral circulation. Respiratory: Normal respiratory effort.  No retractions. Lungs CTAB. Gastrointestinal: Soft left upper quadrant pain with palpation. No distention. Bowel sounds Genitourinary: Deferred Musculoskeletal: No lower extremity tenderness nor edema.  No joint effusions. Neurologic:  Normal speech and language. No gross focal neurologic deficits are appreciated.  Skin:  Skin is warm, dry and intact. No rash noted. Psychiatric: Mood and affect are normal.   ____________________________________________   LABS (all labs ordered are listed, but only abnormal results are displayed)  Labs Reviewed  CBC WITH DIFFERENTIAL/PLATELET - Abnormal; Notable for the following:    WBC 14.0 (*)    RDW 15.2 (*)    Neutro Abs 9.0 (*)    Monocytes Absolute 1.0 (*)    All other components within normal limits  COMPREHENSIVE METABOLIC PANEL - Abnormal; Notable for the following:    Glucose, Bld 118 (*)    Calcium 8.5 (*)    Total Protein 6.3 (*)    AST 13 (*)    ALT 8 (*)    Total Bilirubin <0.1 (*)    All other components within normal limits  URINALYSIS COMPLETEWITH MICROSCOPIC ([facility] ONLY) - Abnormal; Notable for the following:    Color, Urine YELLOW (*)    APPearance CLOUDY (*)    Bacteria, UA RARE (*)    Squamous Epithelial / LPF 6-30 (*)    All other components within normal limits   ____________________________________________  EKG  ED ECG REPORT I, Rebecka Apley, the attending physician, personally viewed and interpreted this ECG.   Date: 06/09/2015  EKG Time: 423  Rate: 80  Rhythm: normal sinus rhythm  Axis: none  Intervals:none  ST&T Change: none  ____________________________________________  RADIOLOGY  CT abdomen and pelvis pending ____________________________________________   PROCEDURES  Procedure(s) performed: None  Critical Care performed:  No  ____________________________________________   INITIAL IMPRESSION / ASSESSMENT AND PLAN / ED COURSE  Pertinent labs & imaging results that were available during my care of the patient were reviewed by me and considered in my medical decision making (see chart for details).  This is 44 year old female who comes in today with some abdominal pain similar to the pain she's had with her malrotation previously. Given the patient's history of bowel rotation I will perform a CT scan to determine patient has any obstruction. The patient received morphine 4 mg IV 1 and Zofran 4 mg IV 1 as well as a liter of normal saline. Once we have received the CT scan results we will reassess the patient and disposition the patient.  The patient's care was signed out to Dr.schaevitz who will follow-up the results of the patient's CT scan. ____________________________________________   FINAL  CLINICAL IMPRESSION(S) / ED DIAGNOSES  Final diagnoses:  Left upper quadrant pain      Rebecka Apley, MD 06/09/15 [phone]

## 2015-06-09 NOTE — Discharge Instructions (Signed)

## 2015-06-09 NOTE — ED Notes (Signed)
Pt uprite on stretcher in exam room with no distress noted; reports since 6pm Sunday having generalized abd pain accomp by N/V; st hx of same and dx with malrotation of intestines

## 2015-06-09 NOTE — ED Notes (Signed)
Pt playing with visitors in bed. No Vomiting seen at this time. NAD. Will continue to monitor for changes.

## 2015-06-09 NOTE — Telephone Encounter (Signed)
Did pt get referral to [facility] surgery?

## 2015-06-10 NOTE — Telephone Encounter (Signed)
All notes have been faxed to [facility] [phone] to be reviewed by Physician. Once reviewed, [facility] will contact pt with appt date and time.

## 2015-06-13 ENCOUNTER — Telehealth: Payer: Self-pay | Admitting: Urgent Care

## 2015-06-13 NOTE — Telephone Encounter (Signed)
Angie, please send this patient referral to main [facility] GI surgeon.  Dr Levert Feinstein is in [place], [place].   Thanks

## 2015-06-26 ENCOUNTER — Telehealth: Payer: Self-pay | Admitting: Urgent Care

## 2015-06-26 NOTE — Telephone Encounter (Signed)
Pt states that the medication (Bentyl) works great, however she believes she is allergic to it. She states that it makes the roof her of mouth peel and is painful. BC of this, she states that she has a hard time eating.

## 2015-06-26 NOTE — Telephone Encounter (Signed)
Pt has an appointment with [facility] Sheppard [facility]onWolf at [facility] [address], [facility]. [facility]

## 2015-07-03 MED ORDER — HYOSCYAMINE SULFATE 0.125 MG PO TABS: 0.1250 mg | ORAL_TABLET | Freq: Four times a day (QID) | ORAL | Status: DC | PRN

## 2015-07-03 NOTE — Telephone Encounter (Signed)
Called patient back at this time and explained that she needs to stop taking Bentyl and this will be placed on her Allergy list.  Spoke with Lorenza BurtonKandice Jones and was told to change medication to Levsin 0.125mg  - 30 minutes prior to each meal and at HS, up to four times daily PRN.  [facility].  Explained to patient that medication has been changed and [facility].  Verbalizes understanding.

## 2015-07-11 ENCOUNTER — Emergency Department
Admission: EM | Admit: 2015-07-11 | Discharge: 2015-07-12 | Disposition: A | Discharge status: Home / Self Care | Payer: Medicare Other | Attending: Emergency Medicine | Admitting: Emergency Medicine

## 2015-07-11 ENCOUNTER — Emergency Department: Payer: Medicare Other

## 2015-07-11 DIAGNOSIS — R079 Chest pain, unspecified: Secondary | ICD-10-CM | POA: Diagnosis present

## 2015-07-11 DIAGNOSIS — Z79899 Other long term (current) drug therapy: Secondary | ICD-10-CM | POA: Diagnosis not present

## 2015-07-11 DIAGNOSIS — Z880 Allergy status to penicillin: Secondary | ICD-10-CM | POA: Insufficient documentation

## 2015-07-11 DIAGNOSIS — Z720 Tobacco use: Secondary | ICD-10-CM | POA: Insufficient documentation

## 2015-07-11 DIAGNOSIS — J441 Chronic obstructive pulmonary disease with (acute) exacerbation: Secondary | ICD-10-CM | POA: Diagnosis not present

## 2015-07-11 LAB — CBC
HEMATOCRIT: 41.8 % (ref 35.0–47.0)
Hemoglobin: 13.9 g/dL (ref 12.0–16.0)
MCH: 29.4 pg (ref 26.0–34.0)
MCHC: 33.2 g/dL (ref 32.0–36.0)
MCV: 88.6 fL (ref 80.0–100.0)
PLATELETS: 175 10*3/uL (ref 150–440)
RBC: 4.72 MIL/uL (ref 3.80–5.20)
RDW: 14.3 % (ref 11.5–14.5)
WBC: 12.2 10*3/uL — AB (ref 3.6–11.0)

## 2015-07-11 LAB — COMPREHENSIVE METABOLIC PANEL
ALK PHOS: 80 U/L (ref 38–126)
ALT: 8 U/L — AB (ref 14–54)
ANION GAP: 8 (ref 5–15)
AST: 12 U/L — ABNORMAL LOW (ref 15–41)
Albumin: 3.6 g/dL (ref 3.5–5.0)
BUN: 11 mg/dL (ref 6–20)
CALCIUM: 8.5 mg/dL — AB (ref 8.9–10.3)
CO2: 21 mmol/L — ABNORMAL LOW (ref 22–32)
Chloride: 114 mmol/L — ABNORMAL HIGH (ref 101–111)
Creatinine, Ser: 0.79 mg/dL (ref 0.44–1.00)
Glucose, Bld: 110 mg/dL — ABNORMAL HIGH (ref 65–99)
Potassium: 3.5 mmol/L (ref 3.5–5.1)
SODIUM: 143 mmol/L (ref 135–145)
TOTAL PROTEIN: 6.3 g/dL — AB (ref 6.5–8.1)

## 2015-07-11 LAB — TROPONIN I

## 2015-07-11 NOTE — ED Notes (Signed)
MD at bedside. 

## 2015-07-11 NOTE — ED Provider Notes (Signed)
[facility] Emergency Department Provider Note  ____________________________________________  Time seen: 11:30 PM  I have reviewed the triage vital signs and the nursing notes.   HISTORY  Chief Complaint Chest Pain     HPI Destiny Simmons is a 44 y.o. female resents with persistent left-sided chest pain 2 days that awoke her from sleep. Patient states pain is worsened with deep inspiration. Current pain score 6 out of 10 and described as pressure. In addition patient admits to dyspnea no cough no fever.She chest pain radiates to her back between her shoulder blades.     Past Medical History  Diagnosis Date  . Sleep apnea   . COPD (chronic obstructive pulmonary disease)   . Asthma   . Malrotation of intestine     Patient Active Problem List   Diagnosis Date Noted  . Intestinal malrotation 05/29/2015  . Non-intractable cyclical vomiting with nausea 05/29/2015  . IBS (irritable bowel syndrome) 05/29/2015    Past Surgical History  Procedure Laterality Date  . Abdominal hysterectomy    . Colonoscopy w/ biopsies  04/02/2015    2 sessile polyps sigmoid colon  . Esophagogastroduodenoscopy endoscopy  04/02/2015    localized mildly erythomatous mucosa w/o active bleeding found in duodenal buld    Current Outpatient Rx  Name  Route  Sig  Dispense  Refill  . LORazepam (ATIVAN) 1 MG tablet   Oral   Take 1 tablet by mouth 3 (three) times daily as needed.      2   . omeprazole (PRILOSEC) 40 MG capsule   Oral   Take 40 mg by mouth daily.         . traMADol (ULTRAM) 50 MG tablet   Oral   Take 1 tablet (50 mg total) by mouth every 6 (six) hours as needed.   15 tablet   0     Allergies Keflex; Penicillins; Ciprofloxacin; Codeine; Dog epithelium; Lamotrigine; Meperidine; Propoxyphene; Vicodin; Latex; Pollen extract; and Tape  Family History  Problem Relation Age of Onset  . Diabetes Father   . Cancer Paternal Aunt     unknown cancer  .  Cancer Paternal Uncle     unknown cancer  . Heart disease Paternal Grandfather     Social History History  Substance Use Topics  . Smoking status: Current Every Day Smoker -- 1.00 packs/day    Types: Cigarettes  . Smokeless tobacco: Not on file  . Alcohol Use: No    Review of Systems  Constitutional: Negative for fever. Eyes: Negative for visual changes. ENT: Negative for sore throat. Cardiovascular: Positive for chest pain. Respiratory: Positive for shortness of breath. Gastrointestinal: Negative for abdominal pain, vomiting and diarrhea. Genitourinary: Negative for dysuria. Musculoskeletal: Negative for back pain. Skin: Negative for rash. Neurological: Negative for headaches, focal weakness or numbness.   10-point ROS otherwise negative.  ____________________________________________   PHYSICAL EXAM:  VITAL SIGNS: ED Triage Vitals  Enc Vitals Group     BP 07/11/15 2102 115/80 mmHg     Pulse Rate 07/11/15 2102 90     Resp 07/11/15 2102 16     Temp 07/11/15 2102 99 F (37.2 C)     Temp Source 07/11/15 2102 Oral     SpO2 07/11/15 2102 99 %     Weight 07/11/15 2102 157 lb (71.215 kg)     Height 07/11/15 2102 5\' 2"  (1.575 m)     Head Cir --      Peak Flow --  Pain Score 07/11/15 2102 5     Pain Loc --      Pain Edu? --      Excl. in GC? --      Constitutional: Alert and oriented. Well appearing and in no distress. Eyes: Conjunctivae are normal. PERRL. Normal extraocular movements. ENT   Head: Normocephalic and atraumatic.   Nose: No congestion/rhinnorhea.   Mouth/Throat: Mucous membranes are moist.   Neck: No stridor. Cardiovascular: Normal rate, regular rhythm. Normal and symmetric distal pulses are present in all extremities. No murmurs, rubs, or gallops. Respiratory: Normal respiratory effort without tachypnea nor retractions. Breath sounds are clear and equal bilaterally. No wheezes/rales/rhonchi. Gastrointestinal: Soft and nontender.  No distention. There is no CVA tenderness. Genitourinary: deferred Musculoskeletal: Nontender with normal range of motion in all extremities. No joint effusions.  No lower extremity tenderness nor edema. Neurologic:  Normal speech and language. No gross focal neurologic deficits are appreciated. Speech is normal.  Skin:  Skin is warm, dry and intact. No rash noted. Psychiatric: Mood and affect are normal. Speech and behavior are normal. Patient exhibits appropriate insight and judgment.  ____________________________________________    LABS (pertinent positives/negatives)  Labs Reviewed  CBC - Abnormal; Notable for the following:    WBC 12.2 (*)    All other components within normal limits  COMPREHENSIVE METABOLIC PANEL - Abnormal; Notable for the following:    Chloride 114 (*)    CO2 21 (*)    Glucose, Bld 110 (*)    Calcium 8.5 (*)    Total Protein 6.3 (*)    AST 12 (*)    ALT 8 (*)    Total Bilirubin <0.1 (*)    All other components within normal limits  TROPONIN I  FIBRIN DERIVATIVES D-DIMER ([facility] ONLY)  TROPONIN I     ____________________________________________   EKG  ED ECG REPORT I, Quanisha Drewry, [place], the attending physician, personally viewed and interpreted this ECG.   Date: 07/11/2015  EKG Time: 8:58 PM  Rate: 89  Rhythm: Normal sinus rhythm  Axis: Normal  Intervals normal  ST&T Change: None   ____________________________________________    RADIOLOGY  IMPRESSION: No active cardiopulmonary disease.    INITIAL IMPRESSION / ASSESSMENT AND PLAN / ED COURSE  Pertinent labs & imaging results that were available during my care of the patient were reviewed by me and considered in my medical decision making (see chart for details).  Considered cardiac etiology for the patient's chest pain as such cardiac enzymes performed 2 which were both negative in addition patient's EKG revealed no evidence of ischemia or infarct. Considered possible pulmonary  emboli as well as aortic aneurysm/dissection as such CT scan of the chest performed which was also negative. CLINICAL findings were discussed with the patient she will be discharged home with outpatient follow-up with cardiology for further cardiac evaluation  ____________________________________________   FINAL CLINICAL IMPRESSION(S) / ED DIAGNOSES  Final diagnoses:  Chest pain, unspecified chest pain type      Darci Current, MD 07/15/15 [phone]

## 2015-07-11 NOTE — ED Notes (Signed)
Pt states onset of left sided chest pain and pressure 2 days ago that woke her from sleep. Pt states has felt "like my tongue and my head are funny", pt states has had shob, no nausea. Pt denies dizziness but states does "feel foggy in my head". Pt states has had chills.

## 2015-07-12 ENCOUNTER — Emergency Department: Payer: Medicare Other

## 2015-07-12 DIAGNOSIS — R079 Chest pain, unspecified: Secondary | ICD-10-CM | POA: Diagnosis not present

## 2015-07-12 LAB — TROPONIN I

## 2015-07-12 LAB — FIBRIN DERIVATIVES D-DIMER (ARMC ONLY): Fibrin derivatives D-dimer (ARMC): 407.19 (ref 0–499)

## 2015-07-12 MED ORDER — MORPHINE SULFATE 4 MG/ML IJ SOLN
INTRAMUSCULAR | Status: AC
  Administered 2015-07-12: 4 mg via INTRAVENOUS
  Filled 2015-07-12: qty 1

## 2015-07-12 MED ORDER — SODIUM CHLORIDE 0.9 % IV BOLUS (SEPSIS)
1000.0000 mL | Freq: Once | INTRAVENOUS | Status: AC
  Administered 2015-07-12: 1000 mL via INTRAVENOUS

## 2015-07-12 MED ORDER — MORPHINE SULFATE 4 MG/ML IJ SOLN
4.0000 mg | Freq: Once | INTRAMUSCULAR | Status: AC
  Administered 2015-07-12: 4 mg via INTRAVENOUS

## 2015-07-12 MED ORDER — IOHEXOL 350 MG/ML SOLN
100.0000 mL | Freq: Once | INTRAVENOUS | Status: AC | PRN
  Administered 2015-07-12: 100 mL via INTRAVENOUS

## 2015-07-12 MED ORDER — ONDANSETRON HCL 4 MG/2ML IJ SOLN
4.0000 mg | Freq: Once | INTRAMUSCULAR | Status: AC
  Administered 2015-07-12: 4 mg via INTRAVENOUS
  Filled 2015-07-12: qty 2

## 2015-07-12 MED ORDER — MORPHINE SULFATE 2 MG/ML IJ SOLN
2.0000 mg | Freq: Once | INTRAMUSCULAR | Status: AC
  Administered 2015-07-12: 2 mg via INTRAVENOUS
  Filled 2015-07-12: qty 1

## 2015-07-12 NOTE — ED Notes (Signed)
Patient transported to CT 

## 2015-07-12 NOTE — Discharge Instructions (Signed)

## 2015-11-28 DIAGNOSIS — J439 Emphysema, unspecified: Secondary | ICD-10-CM | POA: Insufficient documentation

## 2015-12-01 ENCOUNTER — Other Ambulatory Visit: Payer: Self-pay | Admitting: Family Medicine

## 2015-12-01 DIAGNOSIS — Z1231 Encounter for screening mammogram for malignant neoplasm of breast: Secondary | ICD-10-CM

## 2015-12-16 DIAGNOSIS — Z720 Tobacco use: Secondary | ICD-10-CM | POA: Insufficient documentation

## 2015-12-16 DIAGNOSIS — G4730 Sleep apnea, unspecified: Secondary | ICD-10-CM | POA: Insufficient documentation

## 2015-12-16 DIAGNOSIS — G8929 Other chronic pain: Secondary | ICD-10-CM | POA: Insufficient documentation

## 2015-12-16 DIAGNOSIS — M5442 Lumbago with sciatica, left side: Secondary | ICD-10-CM

## 2015-12-16 DIAGNOSIS — F1911 Other psychoactive substance abuse, in remission: Secondary | ICD-10-CM | POA: Insufficient documentation

## 2015-12-16 DIAGNOSIS — K219 Gastro-esophageal reflux disease without esophagitis: Secondary | ICD-10-CM | POA: Insufficient documentation

## 2015-12-18 DIAGNOSIS — R1084 Generalized abdominal pain: Secondary | ICD-10-CM | POA: Insufficient documentation

## 2015-12-18 DIAGNOSIS — K5909 Other constipation: Secondary | ICD-10-CM | POA: Insufficient documentation

## 2016-01-02 ENCOUNTER — Ambulatory Visit
Admission: RE | Admit: 2016-01-02 | Discharge: 2016-01-02 | Disposition: A | Discharge status: Home / Self Care | Payer: Medicare Other | Source: Ambulatory Visit | Attending: Family Medicine | Admitting: Family Medicine

## 2016-01-02 DIAGNOSIS — Z1231 Encounter for screening mammogram for malignant neoplasm of breast: Secondary | ICD-10-CM | POA: Insufficient documentation

## 2016-01-16 DIAGNOSIS — F331 Major depressive disorder, recurrent, moderate: Secondary | ICD-10-CM | POA: Insufficient documentation

## 2016-01-16 DIAGNOSIS — G4731 Primary central sleep apnea: Secondary | ICD-10-CM | POA: Insufficient documentation

## 2016-02-26 ENCOUNTER — Emergency Department: Payer: Medicare Other

## 2016-02-26 ENCOUNTER — Encounter: Payer: Self-pay | Admitting: Radiology

## 2016-02-26 ENCOUNTER — Emergency Department
Admission: EM | Admit: 2016-02-26 | Discharge: 2016-02-26 | Disposition: A | Discharge status: Home / Self Care | Payer: Medicare Other | Attending: Emergency Medicine | Admitting: Emergency Medicine

## 2016-02-26 DIAGNOSIS — Y9289 Other specified places as the place of occurrence of the external cause: Secondary | ICD-10-CM | POA: Diagnosis not present

## 2016-02-26 DIAGNOSIS — S0083XA Contusion of other part of head, initial encounter: Secondary | ICD-10-CM | POA: Diagnosis not present

## 2016-02-26 DIAGNOSIS — S299XXA Unspecified injury of thorax, initial encounter: Secondary | ICD-10-CM | POA: Insufficient documentation

## 2016-02-26 DIAGNOSIS — M62838 Other muscle spasm: Secondary | ICD-10-CM | POA: Diagnosis not present

## 2016-02-26 DIAGNOSIS — Z880 Allergy status to penicillin: Secondary | ICD-10-CM | POA: Diagnosis not present

## 2016-02-26 DIAGNOSIS — F17210 Nicotine dependence, cigarettes, uncomplicated: Secondary | ICD-10-CM | POA: Diagnosis not present

## 2016-02-26 DIAGNOSIS — W108XXA Fall (on) (from) other stairs and steps, initial encounter: Secondary | ICD-10-CM | POA: Insufficient documentation

## 2016-02-26 DIAGNOSIS — Z79899 Other long term (current) drug therapy: Secondary | ICD-10-CM | POA: Diagnosis not present

## 2016-02-26 DIAGNOSIS — G44319 Acute post-traumatic headache, not intractable: Secondary | ICD-10-CM

## 2016-02-26 DIAGNOSIS — Z91040 Latex allergy status: Secondary | ICD-10-CM | POA: Insufficient documentation

## 2016-02-26 DIAGNOSIS — Y9389 Activity, other specified: Secondary | ICD-10-CM | POA: Insufficient documentation

## 2016-02-26 DIAGNOSIS — S0990XA Unspecified injury of head, initial encounter: Secondary | ICD-10-CM | POA: Diagnosis present

## 2016-02-26 DIAGNOSIS — Y998 Other external cause status: Secondary | ICD-10-CM | POA: Diagnosis not present

## 2016-02-26 DIAGNOSIS — W19XXXA Unspecified fall, initial encounter: Secondary | ICD-10-CM

## 2016-02-26 DIAGNOSIS — S0093XA Contusion of unspecified part of head, initial encounter: Secondary | ICD-10-CM

## 2016-02-26 MED ORDER — METHOCARBAMOL 500 MG PO TABS: 500.0000 mg | ORAL_TABLET | Freq: Four times a day (QID) | ORAL | Status: DC

## 2016-02-26 MED ORDER — DIPHENHYDRAMINE HCL 50 MG/ML IJ SOLN
50.0000 mg | Freq: Once | INTRAMUSCULAR | Status: AC
  Administered 2016-02-26: 50 mg via INTRAMUSCULAR
  Filled 2016-02-26: qty 1

## 2016-02-26 MED ORDER — KETOROLAC TROMETHAMINE 30 MG/ML IJ SOLN
60.0000 mg | Freq: Once | INTRAMUSCULAR | Status: AC
  Administered 2016-02-26: 60 mg via INTRAMUSCULAR
  Filled 2016-02-26: qty 2

## 2016-02-26 MED ORDER — PROMETHAZINE HCL 25 MG/ML IJ SOLN
25.0000 mg | Freq: Once | INTRAMUSCULAR | Status: AC
  Administered 2016-02-26: 25 mg via INTRAMUSCULAR
  Filled 2016-02-26: qty 1

## 2016-02-26 MED ORDER — DEXAMETHASONE SODIUM PHOSPHATE 10 MG/ML IJ SOLN
10.0000 mg | Freq: Once | INTRAMUSCULAR | Status: AC
  Administered 2016-02-26: 10 mg via INTRAMUSCULAR
  Filled 2016-02-26: qty 1

## 2016-02-26 NOTE — ED Notes (Signed)
Pt reports fell backwards off four steps and hit back of head on grass 1 day ago.  Pt reports neck soreness as well.  Pt denies LOC, denies numbness/tingling.  Pt reports some disorientation and fatigue following fall.

## 2016-02-26 NOTE — ED Notes (Signed)
Pt in states fell yesterday backwards onto grass due to the wind.  Co pain to back of head, no loc, pt alert and awake.

## 2016-02-26 NOTE — ED Provider Notes (Signed)
[facility] Emergency Department Provider Note  ____________________________________________  Time seen: Approximately 9:56 PM  I have reviewed the triage vital signs and the nursing notes.   HISTORY  Chief Complaint Head Injury    HPI Destiny Simmons is a 45 y.o. female who presents emergency department status post a fall yesterday. Patient states that she was at her back door has 4 steps when a gust of wind caught her off balance and she fell striking the back of her head on grass lawn.Patient states that she has had a headache since with some "aching" and her neck and upper back. Patient denies any radicular symptoms. She denies any loss of consciousness. Patient states that headache is a throbbing sensation located in the posterior aspect. She denies any visual acuity changes, chest pain, shortness of breath, nausea or vomiting. Patient denies any blood thinners.   Past Medical History  Diagnosis Date  . Sleep apnea   . COPD (chronic obstructive pulmonary disease) (HCC)   . Asthma   . Malrotation of intestine     Patient Active Problem List   Diagnosis Date Noted  . Intestinal malrotation 05/29/2015  . Non-intractable cyclical vomiting with nausea 05/29/2015  . IBS (irritable bowel syndrome) 05/29/2015    Past Surgical History  Procedure Laterality Date  . Abdominal hysterectomy    . Colonoscopy w/ biopsies  04/02/2015    2 sessile polyps sigmoid colon  . Esophagogastroduodenoscopy endoscopy  04/02/2015    localized mildly erythomatous mucosa w/o active bleeding found in duodenal buld    Current Outpatient Rx  Name  Route  Sig  Dispense  Refill  . LORazepam (ATIVAN) 1 MG tablet   Oral   Take 1 tablet by mouth 3 (three) times daily as needed.      2   . methocarbamol (ROBAXIN) 500 MG tablet   Oral   Take 1 tablet (500 mg total) by mouth 4 (four) times daily.   16 tablet   0   . omeprazole (PRILOSEC) 40 MG capsule   Oral   Take 40 mg  by mouth daily.         . traMADol (ULTRAM) 50 MG tablet   Oral   Take 1 tablet (50 mg total) by mouth every 6 (six) hours as needed.   15 tablet   0     Allergies Keflex; Penicillins; Ciprofloxacin; Codeine; Dog epithelium; Lamotrigine; Meperidine; Propoxyphene; Vicodin; Latex; Pollen extract; and Tape  Family History  Problem Relation Age of Onset  . Diabetes Father   . Cancer Paternal Aunt     unknown cancer  . Cancer Paternal Uncle     unknown cancer  . Heart disease Paternal Grandfather     Social History Social History  Substance Use Topics  . Smoking status: Current Every Day Smoker -- 1.00 packs/day    Types: Cigarettes  . Smokeless tobacco: None  . Alcohol Use: No     Review of Systems  Constitutional: No fever/chills Eyes: No visual changes.  Cardiovascular: no chest pain. Respiratory: no cough. No SOB. Gastrointestinal:  No nausea, no vomiting. Musculoskeletal: Negative for back pain. Positive for bilateral shoulder/neck/upper back pain. Skin: Negative for rash. Neurological: Negative for headaches, focal weakness or numbness. 10-point ROS otherwise negative.  ____________________________________________   PHYSICAL EXAM:  VITAL SIGNS: ED Triage Vitals  Enc Vitals Group     BP 02/26/16 1954 118/67 mmHg     Pulse Rate 02/26/16 1954 98     Resp 02/26/16  1954 18     Temp 02/26/16 1954 98.2 F (36.8 C)     Temp Source 02/26/16 1954 Oral     SpO2 02/26/16 1954 100 %     Weight --      Height --      Head Cir --      Peak Flow --      Pain Score 02/26/16 1954 8     Pain Loc --      Pain Edu? --      Excl. in GC? --      Constitutional: Alert and oriented. Well appearing and in no acute distress. Eyes: Conjunctivae are normal. PERRL. EOMI. Head: Atraumatic. No ecchymosis, contusions, abrasions, or lacerations noted. Patient is diffusely tender to palpation over the occipital region. No palpable abnormality. No crepitus noted. The  serosanguineous fluid drainage out of the ears or nose. Neck: No stridor.  No cervical spine tenderness to palpation. Patient is diffusely tender to palpation over the paraspinal muscle groups bilaterally in the cervical region. Spasms are noted. Cardiovascular: Normal rate, regular rhythm. Normal S1 and S2.  Good peripheral circulation. Respiratory: Normal respiratory effort without tachypnea or retractions. Lungs CTAB. Musculoskeletal: No lower extremity tenderness nor edema.  No joint effusions. Neurologic:  Normal speech and language. No gross focal neurologic deficits are appreciated. Cranial nerves II through XII grossly intact. Sensation intact 4 extremities and equal. Skin:  Skin is warm, dry and intact. No rash noted. Psychiatric: Mood and affect are normal. Speech and behavior are normal. Patient exhibits appropriate insight and judgement.   ____________________________________________   LABS (all labs ordered are listed, but only abnormal results are displayed)  Labs Reviewed - No data to display ____________________________________________  EKG   ____________________________________________  RADIOLOGY Festus Barren Cuthriell, personally viewed and evaluated these images as part of my medical decision making, as well as reviewing the written report by the radiologist.  Ct Head Wo Contrast  02/26/2016  CLINICAL DATA:  Fall backwards yesterday onto grass, back of the head pain EXAM: CT HEAD WITHOUT CONTRAST TECHNIQUE: Contiguous axial images were obtained from the base of the skull through the vertex without intravenous contrast. COMPARISON:  09/22/2014 FINDINGS: Brain: No evidence of acute infarction, hemorrhage, extra-axial collection, ventriculomegaly, or mass effect. Vascular: No hyperdense vessel or unexpected calcification. Skull: Negative for fracture or focal lesion. Sinuses/Orbits: No acute findings. Other: None. IMPRESSION: No acute intracranial abnormality.  Electronically Signed   By: Natasha Mead M.D.   On: 02/26/2016 20:26    ____________________________________________    PROCEDURES  Procedure(s) performed:       Medications  ketorolac (TORADOL) 30 MG/ML injection 60 mg (not administered)  diphenhydrAMINE (BENADRYL) injection 50 mg (not administered)  promethazine (PHENERGAN) injection 25 mg (not administered)  dexamethasone (DECADRON) injection 10 mg (not administered)     ____________________________________________   INITIAL IMPRESSION / ASSESSMENT AND PLAN / ED COURSE  Pertinent labs & imaging results that were available during my care of the patient were reviewed by me and considered in my medical decision making (see chart for details).  Patient's diagnosis is consistent with fall with head contusion causing a headache, and paraspinal muscle spasms in the cervical region. CT scan reveals no acute intracranial abnormality. Exam is reassuring. Patient is given a headache cocktail here in the emergency department.. Patient will be discharged home with prescriptions for muscle relaxers, patient has meloxicam and is encouraged to use his medication in addition to her muscle relaxer. Patient is to follow  up with primary care provider if symptoms persist past this treatment course. Patient is given ED precautions to return to the ED for any worsening or new symptoms.     ____________________________________________  FINAL CLINICAL IMPRESSION(S) / ED DIAGNOSES  Final diagnoses:  Fall, initial encounter  Head contusion, initial encounter  Acute post-traumatic headache, not intractable  Cervical paraspinal muscle spasm      NEW MEDICATIONS STARTED DURING THIS VISIT:  New Prescriptions   METHOCARBAMOL (ROBAXIN) 500 MG TABLET    Take 1 tablet (500 mg total) by mouth 4 (four) times daily.        Delorise Royals Cuthriell, PA-C 02/26/16 2214  Loleta Rose, MD 02/27/16 [phone]

## 2016-02-26 NOTE — Discharge Instructions (Signed)

## 2016-03-02 DIAGNOSIS — G4761 Periodic limb movement disorder: Secondary | ICD-10-CM | POA: Insufficient documentation

## 2016-03-14 ENCOUNTER — Emergency Department: Payer: Medicare Other

## 2016-03-14 ENCOUNTER — Inpatient Hospital Stay
Admission: EM | Admit: 2016-03-14 | Discharge: 2016-03-15 | DRG: 192 | Disposition: A | Discharge status: Home / Self Care | Payer: Medicare Other | Attending: Internal Medicine | Admitting: Internal Medicine

## 2016-03-14 ENCOUNTER — Encounter: Payer: Self-pay | Admitting: Medical Oncology

## 2016-03-14 DIAGNOSIS — G4733 Obstructive sleep apnea (adult) (pediatric): Secondary | ICD-10-CM | POA: Diagnosis present

## 2016-03-14 DIAGNOSIS — J441 Chronic obstructive pulmonary disease with (acute) exacerbation: Principal | ICD-10-CM | POA: Diagnosis present

## 2016-03-14 DIAGNOSIS — A419 Sepsis, unspecified organism: Secondary | ICD-10-CM

## 2016-03-14 DIAGNOSIS — R509 Fever, unspecified: Secondary | ICD-10-CM

## 2016-03-14 DIAGNOSIS — J440 Chronic obstructive pulmonary disease with acute lower respiratory infection: Secondary | ICD-10-CM | POA: Diagnosis present

## 2016-03-14 DIAGNOSIS — R400 Somnolence: Secondary | ICD-10-CM

## 2016-03-14 DIAGNOSIS — E876 Hypokalemia: Secondary | ICD-10-CM | POA: Diagnosis present

## 2016-03-14 DIAGNOSIS — F17210 Nicotine dependence, cigarettes, uncomplicated: Secondary | ICD-10-CM | POA: Diagnosis present

## 2016-03-14 DIAGNOSIS — J209 Acute bronchitis, unspecified: Secondary | ICD-10-CM

## 2016-03-14 DIAGNOSIS — R0902 Hypoxemia: Secondary | ICD-10-CM | POA: Diagnosis present

## 2016-03-14 DIAGNOSIS — F1290 Cannabis use, unspecified, uncomplicated: Secondary | ICD-10-CM | POA: Diagnosis present

## 2016-03-14 DIAGNOSIS — R0602 Shortness of breath: Secondary | ICD-10-CM | POA: Diagnosis present

## 2016-03-14 DIAGNOSIS — J069 Acute upper respiratory infection, unspecified: Secondary | ICD-10-CM

## 2016-03-14 LAB — COMPREHENSIVE METABOLIC PANEL
ALBUMIN: 3.7 g/dL (ref 3.5–5.0)
ALT: 21 U/L (ref 14–54)
AST: 23 U/L (ref 15–41)
Alkaline Phosphatase: 115 U/L (ref 38–126)
Anion gap: 6 (ref 5–15)
BUN: 12 mg/dL (ref 6–20)
CHLORIDE: 102 mmol/L (ref 101–111)
CO2: 28 mmol/L (ref 22–32)
Calcium: 8.6 mg/dL — ABNORMAL LOW (ref 8.9–10.3)
Creatinine, Ser: 0.69 mg/dL (ref 0.44–1.00)
GFR calc Af Amer: >60 mL/min (ref >60)
GLUCOSE: 118 mg/dL — AB (ref 65–99)
POTASSIUM: 3.1 mmol/L — AB (ref 3.5–5.1)
SODIUM: 136 mmol/L (ref 135–145)
Total Bilirubin: 0.8 mg/dL (ref 0.3–1.2)
Total Protein: 7.3 g/dL (ref 6.5–8.1)

## 2016-03-14 LAB — URINE DRUG SCREEN, QUALITATIVE (ARMC ONLY)
Amphetamines, Ur Screen: NOT DETECTED
BARBITURATES, UR SCREEN: NOT DETECTED
Benzodiazepine, Ur Scrn: POSITIVE — AB
CANNABINOID 50 NG, UR ~~LOC~~: NOT DETECTED
Cocaine Metabolite,Ur ~~LOC~~: NOT DETECTED
MDMA (Ecstasy)Ur Screen: NOT DETECTED
Methadone Scn, Ur: NOT DETECTED
Opiate, Ur Screen: POSITIVE — AB
Phencyclidine (PCP) Ur S: NOT DETECTED
TRICYCLIC, UR SCREEN: NOT DETECTED

## 2016-03-14 LAB — CBC WITH DIFFERENTIAL/PLATELET
Basophils Absolute: 0.1 10*3/uL (ref 0–0.1)
Basophils Relative: 0 %
EOS PCT: 0 %
Eosinophils Absolute: 0 10*3/uL (ref 0–0.7)
HCT: 41.6 % (ref 35.0–47.0)
Hemoglobin: 13.8 g/dL (ref 12.0–16.0)
LYMPHS ABS: 1.7 10*3/uL (ref 1.0–3.6)
LYMPHS PCT: 11 %
MCH: 29 pg (ref 26.0–34.0)
MCHC: 33.1 g/dL (ref 32.0–36.0)
MCV: 87.5 fL (ref 80.0–100.0)
Monocytes Absolute: 0.7 10*3/uL (ref 0.2–0.9)
Monocytes Relative: 5 %
Neutro Abs: 12.5 10*3/uL — ABNORMAL HIGH (ref 1.4–6.5)
Neutrophils Relative %: 84 %
PLATELETS: 183 10*3/uL (ref 150–440)
RBC: 4.76 MIL/uL (ref 3.80–5.20)
RDW: 13.1 % (ref 11.5–14.5)
WBC: 15 10*3/uL — AB (ref 3.6–11.0)

## 2016-03-14 LAB — URINALYSIS COMPLETE WITH MICROSCOPIC (ARMC ONLY)
Bilirubin Urine: NEGATIVE
Glucose, UA: NEGATIVE mg/dL
HGB URINE DIPSTICK: NEGATIVE
Ketones, ur: NEGATIVE mg/dL
Leukocytes, UA: NEGATIVE
NITRITE: NEGATIVE
PH: 7 (ref 5.0–8.0)
PROTEIN: NEGATIVE mg/dL
SPECIFIC GRAVITY, URINE: 1.009 (ref 1.005–1.030)

## 2016-03-14 LAB — INFLUENZA PANEL BY PCR (TYPE A & B)
H1N1FLUPCR: NOT DETECTED
Influenza A By PCR: NEGATIVE
Influenza B By PCR: NEGATIVE

## 2016-03-14 LAB — BLOOD GAS, ARTERIAL
ACID-BASE EXCESS: 2.8 mmol/L (ref 0.0–3.0)
Allens test (pass/fail): POSITIVE — AB
BICARBONATE: 27.9 meq/L (ref 21.0–28.0)
FIO2: 0.28
O2 Saturation: 94.4 %
PATIENT TEMPERATURE: 37
pCO2 arterial: 44 mmHg (ref 32.0–48.0)
pH, Arterial: 7.41 (ref 7.350–7.450)
pO2, Arterial: 72 mmHg — ABNORMAL LOW (ref 83.0–108.0)

## 2016-03-14 LAB — TROPONIN I: Troponin I: <0.03 ng/mL (ref <0.031)

## 2016-03-14 LAB — RAPID INFLUENZA A&B ANTIGENS: Influenza B (ARMC): NEGATIVE

## 2016-03-14 LAB — LACTIC ACID, PLASMA
Lactic Acid, Venous: 0.9 mmol/L (ref 0.5–2.0)
Lactic Acid, Venous: 1.1 mmol/L (ref 0.5–2.0)

## 2016-03-14 LAB — RAPID INFLUENZA A&B ANTIGENS (ARMC ONLY): INFLUENZA A (ARMC): NEGATIVE

## 2016-03-14 MED ORDER — ASPIRIN EC 81 MG PO TBEC
81.0000 mg | DELAYED_RELEASE_TABLET | Freq: Every day | ORAL | Status: DC
  Administered 2016-03-15: 81 mg via ORAL
  Filled 2016-03-14: qty 1

## 2016-03-14 MED ORDER — DEXTROSE 5 % IV SOLN
500.0000 mg | INTRAVENOUS | Status: DC
  Filled 2016-03-14: qty 500

## 2016-03-14 MED ORDER — SODIUM CHLORIDE 0.9 % IV BOLUS (SEPSIS)
500.0000 mL | INTRAVENOUS | Status: AC
  Administered 2016-03-14: 500 mL via INTRAVENOUS

## 2016-03-14 MED ORDER — ACETAMINOPHEN 325 MG PO TABS: 650.0000 mg | ORAL_TABLET | Freq: Four times a day (QID) | ORAL | Status: DC | PRN

## 2016-03-14 MED ORDER — FENTANYL CITRATE (PF) 100 MCG/2ML IJ SOLN
50.0000 ug | Freq: Once | INTRAMUSCULAR | Status: AC
  Administered 2016-03-14: 50 ug via INTRAVENOUS
  Filled 2016-03-14: qty 2

## 2016-03-14 MED ORDER — ONDANSETRON HCL 4 MG PO TABS: 4.0000 mg | ORAL_TABLET | Freq: Four times a day (QID) | ORAL | Status: DC | PRN

## 2016-03-14 MED ORDER — IPRATROPIUM-ALBUTEROL 0.5-2.5 (3) MG/3ML IN SOLN
3.0000 mL | Freq: Four times a day (QID) | RESPIRATORY_TRACT | Status: DC
  Administered 2016-03-14 – 2016-03-15 (×2): 3 mL via RESPIRATORY_TRACT
  Filled 2016-03-14 (×2): qty 3

## 2016-03-14 MED ORDER — PANTOPRAZOLE SODIUM 40 MG IV SOLR
40.0000 mg | Freq: Two times a day (BID) | INTRAVENOUS | Status: DC
  Administered 2016-03-14: 40 mg via INTRAVENOUS
  Filled 2016-03-14 (×2): qty 40

## 2016-03-14 MED ORDER — CLONAZEPAM 0.5 MG PO TABS
0.5000 mg | ORAL_TABLET | Freq: Three times a day (TID) | ORAL | Status: DC | PRN
  Administered 2016-03-14 – 2016-03-15 (×2): 0.5 mg via ORAL
  Filled 2016-03-14 (×2): qty 1

## 2016-03-14 MED ORDER — SODIUM CHLORIDE 0.9 % IV BOLUS (SEPSIS)
1000.0000 mL | INTRAVENOUS | Status: AC
  Administered 2016-03-14 (×2): 1000 mL via INTRAVENOUS

## 2016-03-14 MED ORDER — ACETAMINOPHEN 650 MG RE SUPP: 650.0000 mg | Freq: Four times a day (QID) | RECTAL | Status: DC | PRN

## 2016-03-14 MED ORDER — DOCUSATE SODIUM 100 MG PO CAPS
100.0000 mg | ORAL_CAPSULE | Freq: Two times a day (BID) | ORAL | Status: DC
  Administered 2016-03-15: 100 mg via ORAL
  Filled 2016-03-14: qty 1

## 2016-03-14 MED ORDER — MOMETASONE FURO-FORMOTEROL FUM 200-5 MCG/ACT IN AERO
2.0000 | INHALATION_SPRAY | Freq: Two times a day (BID) | RESPIRATORY_TRACT | Status: DC
  Administered 2016-03-14 – 2016-03-15 (×2): 2 via RESPIRATORY_TRACT
  Filled 2016-03-14: qty 8.8

## 2016-03-14 MED ORDER — AZITHROMYCIN 500 MG IV SOLR: 500.0000 mg | INTRAVENOUS | Status: DC

## 2016-03-14 MED ORDER — LEVOFLOXACIN IN D5W 750 MG/150ML IV SOLN
750.0000 mg | Freq: Once | INTRAVENOUS | Status: AC
  Administered 2016-03-14: 750 mg via INTRAVENOUS
  Filled 2016-03-14: qty 150

## 2016-03-14 MED ORDER — HEPARIN SODIUM (PORCINE) 5000 UNIT/ML IJ SOLN
5000.0000 [IU] | Freq: Three times a day (TID) | INTRAMUSCULAR | Status: DC
  Administered 2016-03-14 – 2016-03-15 (×2): 5000 [IU] via SUBCUTANEOUS
  Filled 2016-03-14 (×2): qty 1

## 2016-03-14 MED ORDER — GABAPENTIN 600 MG PO TABS
600.0000 mg | ORAL_TABLET | Freq: Three times a day (TID) | ORAL | Status: DC
  Administered 2016-03-14 – 2016-03-15 (×2): 600 mg via ORAL
  Filled 2016-03-14 (×2): qty 1

## 2016-03-14 MED ORDER — MORPHINE SULFATE (PF) 2 MG/ML IV SOLN
2.0000 mg | INTRAVENOUS | Status: DC | PRN
  Administered 2016-03-14 – 2016-03-15 (×3): 2 mg via INTRAVENOUS
  Filled 2016-03-14 (×3): qty 1

## 2016-03-14 MED ORDER — BISACODYL 10 MG RE SUPP: 10.0000 mg | Freq: Every day | RECTAL | Status: DC | PRN

## 2016-03-14 MED ORDER — ONDANSETRON HCL 4 MG/2ML IJ SOLN: 4.0000 mg | Freq: Four times a day (QID) | INTRAMUSCULAR | Status: DC | PRN

## 2016-03-14 MED ORDER — LUBIPROSTONE 24 MCG PO CAPS
24.0000 ug | ORAL_CAPSULE | Freq: Two times a day (BID) | ORAL | Status: DC
  Administered 2016-03-14 – 2016-03-15 (×2): 24 ug via ORAL
  Filled 2016-03-14 (×2): qty 1

## 2016-03-14 MED ORDER — ONDANSETRON HCL 4 MG/2ML IJ SOLN
4.0000 mg | Freq: Once | INTRAMUSCULAR | Status: AC
  Administered 2016-03-14: 4 mg via INTRAVENOUS
  Filled 2016-03-14: qty 2

## 2016-03-14 MED ORDER — POTASSIUM CHLORIDE IN NACL 20-0.9 MEQ/L-% IV SOLN
INTRAVENOUS | Status: DC
  Administered 2016-03-14 – 2016-03-15 (×2): via INTRAVENOUS
  Filled 2016-03-14 (×5): qty 1000

## 2016-03-14 MED ORDER — DULOXETINE HCL 60 MG PO CPEP
60.0000 mg | ORAL_CAPSULE | ORAL | Status: DC
  Administered 2016-03-15: 60 mg via ORAL
  Filled 2016-03-14: qty 1

## 2016-03-14 MED ORDER — METHYLPREDNISOLONE SODIUM SUCC 125 MG IJ SOLR
60.0000 mg | Freq: Two times a day (BID) | INTRAMUSCULAR | Status: DC
  Administered 2016-03-15: 60 mg via INTRAVENOUS
  Filled 2016-03-14: qty 2

## 2016-03-14 NOTE — Progress Notes (Signed)
Patient questioning why her klonopin was not ordered as prescribed. MD paged. Klonopin ordered.

## 2016-03-14 NOTE — ED Provider Notes (Addendum)
[facility] Emergency Department Provider Note  Time seen: 1:28 PM  I have reviewed the triage vital signs and the nursing notes.   HISTORY  Chief Complaint Sore Throat; Headache; and Fever    HPI Destiny Simmons is a 45 y.o. female with a past medical history of COPD, asthma, presents to the emergency department with cough, congestion, fever, generalized body aches and headache. According to the patient for the past 1-2 weeks she has had a sore throat, cough, over the past several days she states the cough is worse, she has a fever, with generalized weakness and body aches.  Patient states diffuse body aches, moderate headache. Mild cough with significant congestion. Mild sore throat. Patient states she feels significantly weak.   Past Medical History  Diagnosis Date  . Sleep apnea   . COPD (chronic obstructive pulmonary disease) (HCC)   . Asthma   . Malrotation of intestine     Patient Active Problem List   Diagnosis Date Noted  . Intestinal malrotation 05/29/2015  . Non-intractable cyclical vomiting with nausea 05/29/2015  . IBS (irritable bowel syndrome) 05/29/2015    Past Surgical History  Procedure Laterality Date  . Abdominal hysterectomy    . Colonoscopy w/ biopsies  04/02/2015    2 sessile polyps sigmoid colon  . Esophagogastroduodenoscopy endoscopy  04/02/2015    localized mildly erythomatous mucosa w/o active bleeding found in duodenal buld    Current Outpatient Rx  Name  Route  Sig  Dispense  Refill  . LORazepam (ATIVAN) 1 MG tablet   Oral   Take 1 tablet by mouth 3 (three) times daily as needed.      2   . methocarbamol (ROBAXIN) 500 MG tablet   Oral   Take 1 tablet (500 mg total) by mouth 4 (four) times daily.   16 tablet   0   . omeprazole (PRILOSEC) 40 MG capsule   Oral   Take 40 mg by mouth daily.         . traMADol (ULTRAM) 50 MG tablet   Oral   Take 1 tablet (50 mg total) by mouth every 6 (six) hours as needed.  15 tablet   0     Allergies Keflex; Penicillins; Ciprofloxacin; Codeine; Dog epithelium; Lamotrigine; Meperidine; Propoxyphene; Vicodin; Latex; Pollen extract; and Tape  Family History  Problem Relation Age of Onset  . Diabetes Father   . Cancer Paternal Aunt     unknown cancer  . Cancer Paternal Uncle     unknown cancer  . Heart disease Paternal Grandfather     Social History Social History  Substance Use Topics  . Smoking status: Current Every Day Smoker -- 1.00 packs/day    Types: Cigarettes  . Smokeless tobacco: None  . Alcohol Use: No    Review of Systems Constitutional: Positive fever. Positive congestion. Cardiovascular: Negative for chest pain. Respiratory:  positive for shortness of breath. Positive for cough. Gastrointestinal: Negative for abdominal pain, vomiting and diarrhea. Genitourinary: Negative for dysuria. Musculoskeletal: Negative for back pain. positive for body aches.  Neurological:  positive for headache 10-point ROS otherwise negative.  ____________________________________________   PHYSICAL EXAM:  VITAL SIGNS: ED Triage Vitals  Enc Vitals Group     BP 03/14/16 1216 130/80 mmHg     Pulse Rate 03/14/16 1216 125     Resp 03/14/16 1216 21     Temp 03/14/16 1216 100.6 F (38.1 C)     Temp Source 03/14/16 1216 Oral  SpO2 03/14/16 1216 92 %     Weight 03/14/16 1216 180 lb (81.647 kg)     Height 03/14/16 1216 5\' 2"  (1.575 m)     Head Cir --      Peak Flow --      Pain Score 03/14/16 1216 9     Pain Loc --      Pain Edu? --      Excl. in GC? --     Constitutional: Alert and oriented. Somnolent, but does awaken to voice and initial questions appropriately. Eyes: Normal exam ENT   Head: Normocephalic and atraumatic.   Nose: Moderate congestion   Mouth/Throat: Mucous membranes are moist. mild pharyngeal erythema without exudate.  Cardiovascular: Normal rate, regular rhythm. No murmur Respiratory: Normal respiratory effort  without tachypnea nor retractions. Breath sounds are clear Gastrointestinal: Soft and nontender. No distention.  Musculoskeletal: Nontender with normal range of motion in all extremities. No lower extremity tenderness or edema. Neurologic:  Normal speech and language. No gross focal neurologic deficits  Skin:  Skin is warm, dry and intact.  Psychiatric: Mood and affect are normal. Speech and behavior are normal.   ____________________________________________    EKG  EKG reviewed and interpreted by myself shows sinus tachycardia 116 bpm, narrow QRS, normal axis, normal intervals, nonspecific ST changes. No ST elevations.  ____________________________________________    RADIOLOGY  Chest x-ray shows no acute findings.  ____________________________________________    INITIAL IMPRESSION / ASSESSMENT AND PLAN / ED COURSE  Pertinent labs & imaging results that were available during my care of the patient were reviewed by me and considered in my medical decision making (see chart for details).  Patient presents the emergency department with generalized weakness along with upper respiratory/viral syndrome symptoms. Patient is hypoxic to 88% on room air. White blood cell count of 15,000. Currently awaiting influenza swab and lab results as well as a chest x-ray. Anticipate likely admission given hypoxia.  Chest x-ray shows no acute findings. Influenza negative. Patient continues to desat to 87% on oxygen. Patient continues with significant cough. Labs otherwise within normal limits including urinalysis. Patient remains very somnolent, will awaken to voice, but falls asleep soon after. Patient also describes a headache, given a headache and fever I discussed performing a lumbar puncture with the patient which she is adamantly opposed to. I discussed with the patient that there is no way that I can rule out meningitis without a lumbar puncture. She understands, states her children have had lumbar  punctures in the past rule out meningitis and she does not want a lumbar puncture. We will cover with antibiotics and admitted to the hospital given her somnolence, fever and elevated white blood cell count.  ____________________________________________   FINAL CLINICAL IMPRESSION(S) / ED DIAGNOSES  hypoxic Upper respiratory infection Fever  Minna AntisKevin Edell Mesenbrink, MD 03/14/16 1515  Minna AntisKevin Petrice Beedy, MD 03/14/16 [phone]

## 2016-03-14 NOTE — ED Notes (Signed)
Pt reports that she has had fever, sore throat, body aches, shortness of breath and headache x 1 week.

## 2016-03-14 NOTE — ED Notes (Addendum)
Report given to Jadie, RN.  

## 2016-03-14 NOTE — ED Notes (Addendum)
MD notified patient is more awake at this time. Per Dr. Judithann SheenSparks, pt can be given a meal tray. Pt states, "I'm not going to sit here and starve." Pt given meal tray and agrees to stay in the hospital, but refuses to wear oxygen at this time. Reeves DamJadie, RN notified of changes at this time.

## 2016-03-14 NOTE — H&P (Signed)
History and Physical    Destiny NapoleonCheryl C Azzara ZOX:[id number] DOB: 09/02/1971 DOA: 03/14/2016  Referring physician: Dr. Lenard LancePaduchowski PCP: Corky DownsMASOUD, JAVED, MD  Specialists: none  Chief Complaint: SOB/cough/weakness  HPI: Destiny Simmons is a 45 y.o. female has a past medical history significant for COPD/tobacco use, OSA who presents to ER with 1 week hx of progressive weakness with fever, cough, congestion, and  SOB. In ER, pt was noted to be extremely somnolent. CXR was negative but the patient was febrile, tachycardic, and hypoxic. She is now admitted. Difficult to obtain hx as patient is so somnolent and falls asleep mid-sentence. Does arouse to painful stimulii. Denies N/V/D. Denies CP. Her cough is non-productive  Review of Systems: The patient denies anorexia, weight loss,, vision loss, decreased hearing, hoarseness, chest pain, syncope, peripheral edema, balance deficits, hemoptysis, abdominal pain, melena, hematochezia, severe indigestion/heartburn, hematuria, incontinence, genital sores, muscle weakness, suspicious skin lesions, transient blindness, difficulty walking, depression, unusual weight change, abnormal bleeding, enlarged lymph nodes, angioedema, and breast masses.   Past Medical History  Diagnosis Date  . Sleep apnea   . COPD (chronic obstructive pulmonary disease) (HCC)   . Asthma   . Malrotation of intestine    Past Surgical History  Procedure Laterality Date  . Abdominal hysterectomy    . Colonoscopy w/ biopsies  04/02/2015    2 sessile polyps sigmoid colon  . Esophagogastroduodenoscopy endoscopy  04/02/2015    localized mildly erythomatous mucosa w/o active bleeding found in duodenal buld   Social History:  reports that she has been smoking Cigarettes.  She has been smoking about 1.00 pack per day. She does not have any smokeless tobacco history on file. She reports that she uses illicit drugs (Marijuana). She reports that she does not drink alcohol.  Allergies  Allergen  Reactions  . Keflex [Cephalexin] Hives, Itching and Rash  . Penicillins Rash    Unknown; as a child Pt unsure of reaction, occurred when she was young  . Ciprofloxacin Hives and Itching    cipro  . Codeine Diarrhea and Nausea And Vomiting  . Dog Epithelium Itching and Cough  . Lamotrigine     Blisters  . Meperidine Nausea And Vomiting and Other (See Comments)  . Propoxyphene Nausea And Vomiting  . Vicodin [Hydrocodone-Acetaminophen] Hives, Itching, Nausea Only and Nausea And Vomiting    Itchting, nausea  . Latex Rash  . Pollen Extract Itching and Rash  . Tape Rash    Family History  Problem Relation Age of Onset  . Diabetes Father   . Cancer Paternal Aunt     unknown cancer  . Cancer Paternal Uncle     unknown cancer  . Heart disease Paternal Grandfather     Prior to Admission medications   Medication Sig Start Date End Date Taking? Authorizing Provider  LORazepam (ATIVAN) 1 MG tablet Take 1 tablet by mouth 3 (three) times daily as needed. 06/02/15   Historical Provider, MD  methocarbamol (ROBAXIN) 500 MG tablet Take 1 tablet (500 mg total) by mouth 4 (four) times daily. 02/26/16   Delorise RoyalsJonathan D Cuthriell, PA-C  omeprazole (PRILOSEC) 40 MG capsule Take 40 mg by mouth daily.    Historical Provider, MD  traMADol (ULTRAM) 50 MG tablet Take 1 tablet (50 mg total) by mouth every 6 (six) hours as needed. 06/09/15 06/08/16  Myrna Blazeravid Matthew Schaevitz, MD   Physical Exam: Filed Vitals:   03/14/16 1300 03/14/16 1330 03/14/16 1400 03/14/16 1500  BP: 110/86 113/76 126/84 124/75  Pulse: 117  101 104 106  Temp:      TempSrc:      Resp: 32  Height:      Weight:      SpO2: 97% 100% 96% 95%     General:  WDWN, letargic nut in NAD. [place]/AT  Eyes: PERRL, EOMI, no scleral icterus, conjunctiva clear  ENT: dry oropharynx without exudate or lesions, TM's benign, dentition fair  Neck: supple, no lymphadenopathy. No thyromegaly or bruits  Cardiovascular: rapid rate with regular rhythm  without MRG; 2+ peripheral pulses, no JVD, no peripheral edema  Respiratory: diffuse rhonchi without wheezes or rales. No dullness. Respiratory effort increased  Abdomen: soft, non tender to palpation, positive bowel sounds, no guarding, no rebound, no organomegaly  Skin: no rashes or lesions  Musculoskeletal: normal bulk and tone, no joint swelling  Psychiatric: normal mood and affect, lethargic but oriented X 3.  Neurologic: CN 2-12 grossly intact, Motor strength 5/5 in all 4 groups, DTR's symmetric, sensory exam non-focal  Labs on Admission:  Basic Metabolic Panel:  Recent Labs Lab 03/14/16 1244  NA 136  K 3.1*  CL 102  CO2 28  GLUCOSE 118*  BUN 12  CREATININE 0.69  CALCIUM 8.6*   Liver Function Tests:  Recent Labs Lab 03/14/16 1244  AST 23  ALT 21  ALKPHOS 115  BILITOT 0.8  PROT 7.3  ALBUMIN 3.7   No results for input(s): LIPASE, AMYLASE in the last 168 hours. No results for input(s): AMMONIA in the last 168 hours. CBC:  Recent Labs Lab 03/14/16 1244  WBC 15.0*  NEUTROABS 12.5*  HGB 13.8  HCT 41.6  MCV 87.5  PLT 183   Cardiac Enzymes:  Recent Labs Lab 03/14/16 1244  TROPONINI <0.03    BNP (last 3 results) No results for input(s): BNP in the last 8760 hours.  ProBNP (last 3 results) No results for input(s): PROBNP in the last 8760 hours.  CBG: No results for input(s): GLUCAP in the last 168 hours.  Radiological Exams on Admission: Dg Chest Port 1 View  03/14/2016  CLINICAL DATA:  Pt reports that she has had fever, sore throat, body aches, shortness of breath and headache x 1 week. Smoker. EXAM: PORTABLE CHEST 1 VIEW COMPARISON:  07/11/2015 FINDINGS: The heart size and mediastinal contours are within normal limits. Both lungs are clear. The visualized skeletal structures are unremarkable. IMPRESSION: No active disease. Electronically Signed   By: Norva Pavlov M.D.   On: 03/14/2016 13:33    EKG: Independently  reviewed.  Assessment/Plan Principal Problem:   Sepsis (HCC) Active Problems:   COPD exacerbation (HCC)   Acute bronchitis   Somnolence   Cultures sent. Will check drug screen and ABG. Begin IV fluids with IV ABX, O2, and IV steroids with SVN's. Repeat labs and CXR in AM.  Diet: clear liquids Fluids: NS with K+@125  DVT Prophylaxis: SQ Heparin  Code Status: FULL  Family Communication: none  Disposition Plan: home  Time spent: 55 min

## 2016-03-15 ENCOUNTER — Inpatient Hospital Stay: Payer: Medicare Other

## 2016-03-15 LAB — CBC
HCT: 33.1 % — ABNORMAL LOW (ref 35.0–47.0)
Hemoglobin: 11.2 g/dL — ABNORMAL LOW (ref 12.0–16.0)
MCH: 30.1 pg (ref 26.0–34.0)
MCHC: 33.7 g/dL (ref 32.0–36.0)
MCV: 89.2 fL (ref 80.0–100.0)
PLATELETS: 137 10*3/uL — AB (ref 150–440)
RBC: 3.71 MIL/uL — AB (ref 3.80–5.20)
RDW: 13.1 % (ref 11.5–14.5)
WBC: 8.5 10*3/uL (ref 3.6–11.0)

## 2016-03-15 LAB — COMPREHENSIVE METABOLIC PANEL
ALBUMIN: 2.7 g/dL — AB (ref 3.5–5.0)
ALT: 15 U/L (ref 14–54)
AST: 15 U/L (ref 15–41)
Alkaline Phosphatase: 82 U/L (ref 38–126)
Anion gap: 2 — ABNORMAL LOW (ref 5–15)
BUN: 12 mg/dL (ref 6–20)
CHLORIDE: 110 mmol/L (ref 101–111)
CO2: 26 mmol/L (ref 22–32)
CREATININE: 0.55 mg/dL (ref 0.44–1.00)
Calcium: 7.6 mg/dL — ABNORMAL LOW (ref 8.9–10.3)
GFR calc Af Amer: >60 mL/min (ref >60)
GFR calc non Af Amer: >60 mL/min (ref >60)
Glucose, Bld: 155 mg/dL — ABNORMAL HIGH (ref 65–99)
POTASSIUM: 3.1 mmol/L — AB (ref 3.5–5.1)
SODIUM: 138 mmol/L (ref 135–145)
Total Bilirubin: 0.4 mg/dL (ref 0.3–1.2)
Total Protein: 5.6 g/dL — ABNORMAL LOW (ref 6.5–8.1)

## 2016-03-15 MED ORDER — AZITHROMYCIN 500 MG PO TABS: 500.0000 mg | ORAL_TABLET | Freq: Every day | ORAL | Status: DC

## 2016-03-15 MED ORDER — POTASSIUM CHLORIDE CRYS ER 20 MEQ PO TBCR
40.0000 meq | EXTENDED_RELEASE_TABLET | Freq: Once | ORAL | Status: AC
  Administered 2016-03-15: 40 meq via ORAL
  Filled 2016-03-15: qty 2

## 2016-03-15 NOTE — Progress Notes (Signed)
PT Cancellation Note  Patient Details Name: Volanda NapoleonCheryl C Bernier MRN: [id number] DOB: 05/20/1971   Cancelled Treatment:    Reason Eval/Treat Not Completed: Patient declined, no reason specified. Chart reviewed and care manager consulted. Attempted to evaluate patient however pt declined. Pt is standing up at sink bathing herself. Pt states she is discharging soon and has no PT needs. Requested order be cancelled. Will complete order at this time. Please enter new order if status or needs change.  Sharalyn InkJason D Huprich PT, DPT   Huprich,Jason 03/15/2016, 9:49 AM

## 2016-03-15 NOTE — Discharge Summary (Addendum)
[facility] - [place] at [facility]   PATIENT NAME: Destiny Simmons    MR#:  [id number]  DATE OF BIRTH:  1971-01-06  DATE OF ADMISSION:  03/14/2016 ADMITTING PHYSICIAN: Marguarite Arbour, MD  DATE OF DISCHARGE: 03/15/2016  PRIMARY CARE PHYSICIAN: Esperanza Sheets, PA [facility])   ADMISSION DIAGNOSIS:  SOB (shortness of breath) [R06.02] URI (upper respiratory infection) [J06.9] Hypoxia [R09.02] Fever, unspecified fever cause [R50.9]  DISCHARGE DIAGNOSIS:  Active Problems:   Acute bronchitis   Somnolence  sepsis ruled out  SECONDARY DIAGNOSIS:   Past Medical History  Diagnosis Date  . Sleep apnea   . COPD (chronic obstructive pulmonary disease) (HCC)   . Asthma   . Malrotation of intestine     HOSPITAL COURSE:  45 y.o. female has a past medical history significant for COPD/tobacco use, OSA who was admitted with 1 week hx of progressive weakness with fever, cough, congestion, and SOB. In ER, pt was noted to be extremely somnolent. CXR was negative but the patient was febrile, tachycardic, and hypoxic. Her cough is non-productive.  Please see Dr. Suzi Roots dictated history and physical for further details.  Patient has remained afebrile and all her vitals have been stable.  She is no longer hypoxic, and saturating 100% on room air.  Sepsis has been ruled out.  Her repeat chest x-ray seems negative for any pneumonia.  She is feeling much better and is wanting to go home.  Her breathing seems quite back to her baseline.  This seemed to be acute viral illness, likely URI.  Hypokalemia: Repleted.  She has already set up a new primary care physician, Dr. Esperanza Sheets at [facility] and already has appointment set up for tomorrow.  She is being discharged home in stable condition.  She is agreeable with the discharge plans. DISCHARGE CONDITIONS:   Stable  CONSULTS OBTAINED:     DRUG ALLERGIES:   Allergies  Allergen Reactions  .  Keflex [Cephalexin] Hives, Itching and Rash  . Penicillins Rash    Unknown; as a child Pt unsure of reaction, occurred when she was young  . Ciprofloxacin Hives and Itching    cipro  . Codeine Diarrhea and Nausea And Vomiting  . Dog Epithelium Itching and Cough  . Lamotrigine     Blisters  . Meperidine Nausea And Vomiting and Other (See Comments)  . Propoxyphene Nausea And Vomiting  . Vicodin [Hydrocodone-Acetaminophen] Hives, Itching, Nausea Only and Nausea And Vomiting    Itchting, nausea  . Latex Rash  . Pollen Extract Itching and Rash  . Tape Rash    DISCHARGE MEDICATIONS:   Current Discharge Medication List    START taking these medications   Details  azithromycin (ZITHROMAX) 500 MG tablet Take 1 tablet (500 mg total) by mouth daily. Qty: 5 tablet, Refills: 0      CONTINUE these medications which have NOT CHANGED   Details  albuterol (PROAIR HFA) 108 (90 Base) MCG/ACT inhaler Inhale 2 puffs into the lungs every 6 (six) hours as needed. For wheezing.    clonazePAM (KLONOPIN) 1 MG tablet Take 1 mg by mouth 3 (three) times daily.    DULoxetine (CYMBALTA) 60 MG capsule Take 60 mg by mouth every morning.    etodolac (LODINE) 400 MG tablet Take 400 mg by mouth 2 (two) times daily.    gabapentin (NEURONTIN) 600 MG tablet Take 600 mg by mouth 3 (three) times daily.    lubiprostone (AMITIZA) 24 MCG capsule Take  24 mcg by mouth 2 (two) times daily with a meal.    methocarbamol (ROBAXIN) 500 MG tablet Take 1 tablet (500 mg total) by mouth 4 (four) times daily. Qty: 16 tablet, Refills: 0    omeprazole (PRILOSEC) 40 MG capsule Take 40 mg by mouth daily.    oxyCODONE-acetaminophen (PERCOCET) 10-325 MG tablet Take 1 tablet by mouth every 4 (four) hours as needed for pain.    pregabalin (LYRICA) 225 MG capsule Take 225 mg by mouth 2 (two) times daily.    QUEtiapine (SEROQUEL) 300 MG tablet Take 300 mg by mouth at bedtime.    varenicline (CHANTIX) 1 MG tablet Take 1 mg by  mouth 2 (two) times daily.         DISCHARGE INSTRUCTIONS:    DIET:  Regular diet  DISCHARGE CONDITION:  Good  ACTIVITY:  Activity as tolerated  OXYGEN:  Home Oxygen: No.   Oxygen Delivery: room air  DISCHARGE LOCATION:  home   If you experience worsening of your admission symptoms, develop shortness of breath, life threatening emergency, suicidal or homicidal thoughts you must seek medical attention immediately by calling 911 or calling your MD immediately  if symptoms less severe.  You Must read complete instructions/literature along with all the possible adverse reactions/side effects for all the Medicines you take and that have been prescribed to you. Take any new Medicines after you have completely understood and accpet all the possible adverse reactions/side effects.   Please note  You were cared for by a hospitalist during your hospital stay. If you have any questions about your discharge medications or the care you received while you were in the hospital after you are discharged, you can call the unit and asked to speak with the hospitalist on call if the hospitalist that took care of you is not available. Once you are discharged, your primary care physician will handle any further medical issues. Please note that NO REFILLS for any discharge medications will be authorized once you are discharged, as it is imperative that you return to your primary care physician (or establish a relationship with a primary care physician if you do not have one) for your aftercare needs so that they can reassess your need for medications and monitor your lab values.    On the day of Discharge:  VITAL SIGNS:  Blood pressure 123/74, pulse 86, temperature 98.2 F (36.8 C), temperature source Oral, resp. rate 18, height 5\' 2"  (1.575 m), weight 81.647 kg (180 lb), SpO2 100 %. PHYSICAL EXAMINATION:  GENERAL:  45 y.o.-year-old patient lying in the bed with no acute distress.  EYES: Pupils  equal, round, reactive to light and accommodation. No scleral icterus. Extraocular muscles intact.  HEENT: Head atraumatic, normocephalic. Oropharynx and nasopharynx clear.  NECK:  Supple, no jugular venous distention. No thyroid enlargement, no tenderness.  LUNGS: Normal breath sounds bilaterally, no wheezing, rales,rhonchi or crepitation. No use of accessory muscles of respiration.  CARDIOVASCULAR: S1, S2 normal. No murmurs, rubs, or gallops.  ABDOMEN: Soft, non-tender, non-distended. Bowel sounds present. No organomegaly or mass.  EXTREMITIES: No pedal edema, cyanosis, or clubbing.  NEUROLOGIC: Cranial nerves II through XII are intact. Muscle strength 5/5 in all extremities. Sensation intact. Gait not checked.  PSYCHIATRIC: The patient is alert and oriented x 3.  SKIN: No obvious rash, lesion, or ulcer.  DATA REVIEW:   CBC  Recent Labs Lab 03/15/16 0413  WBC 8.5  HGB 11.2*  HCT 33.1*  PLT 137*    Chemistries  Recent Labs Lab 03/15/16 0413  NA 138  K 3.1*  CL 110  CO2 26  GLUCOSE 155*  BUN 12  CREATININE 0.55  CALCIUM 7.6*  AST 15  ALT 15  ALKPHOS 82  BILITOT 0.4    Cardiac Enzymes  Recent Labs Lab 03/14/16 1244  TROPONINI <0.03    Microbiology Results  Results for orders placed or performed during the hospital encounter of 03/14/16  Rapid Influenza A&B Antigens ([facility] only)     Status: None   Collection Time: 03/14/16 12:44 PM  Result Value Ref Range Status   Influenza A ([facility]) NEGATIVE NEGATIVE Final   Influenza B ([facility]) NEGATIVE NEGATIVE Final  Blood Culture (routine x 2)     Status: None (Preliminary result)   Collection Time: 03/14/16 12:44 PM  Result Value Ref Range Status   Specimen Description BLOOD RIGHT WRIST  Final   Special Requests BOTTLES DRAWN AEROBIC AND ANAEROBIC 10 CC  Final   Culture NO GROWTH < 24 HOURS  Final   Report Status PENDING  Incomplete  Blood Culture (routine x 2)     Status: None (Preliminary result)   Collection Time:  03/14/16 12:44 PM  Result Value Ref Range Status   Specimen Description BLOOD LEFT ASSIST CONTROL  Final   Special Requests BOTTLES DRAWN AEROBIC AND ANAEROBIC 10 CC  Final   Culture NO GROWTH < 24 HOURS  Final   Report Status PENDING  Incomplete    RADIOLOGY:  Dg Chest 2 View  03/15/2016  CLINICAL DATA:  45 year old female with 1 week of progressive weakness fever cough congestion and shortness of breath. Initial encounter. EXAM: CHEST  2 VIEW COMPARISON:  Portable chest radiograph 03/14/2016. Chest CTA 07/12/2015, and earlier FINDINGS: Stable lung volumes. Normal cardiac size and mediastinal contours. Visualized tracheal air column is within normal limits. No pneumothorax, pulmonary edema, or pleural effusion. Mildly increased streaky and linear opacity in the lingula without consolidation or confluent pulmonary opacity. No acute osseous abnormality identified. IMPRESSION: Mildly increased opacity in the lingula could reflect mild infection or atelectasis. No other acute cardiopulmonary abnormality. Electronically Signed   By: Odessa Fleming M.D.   On: 03/15/2016 07:53   Dg Chest Port 1 View  03/14/2016  CLINICAL DATA:  Pt reports that she has had fever, sore throat, body aches, shortness of breath and headache x 1 week. Smoker. EXAM: PORTABLE CHEST 1 VIEW COMPARISON:  07/11/2015 FINDINGS: The heart size and mediastinal contours are within normal limits. Both lungs are clear. The visualized skeletal structures are unremarkable. IMPRESSION: No active disease. Electronically Signed   By: Norva Pavlov M.D.   On: 03/14/2016 13:33     Management plans discussed with the patient, family and they are in agreement.  CODE STATUS: Full code  TOTAL TIME TAKING CARE OF THIS PATIENT: 45 minutes.    [facility], Czarina Gingras M.D on 03/15/2016 at 8:20 AM  Between 7am to 6pm - Pager - [phone]  After 6pm go to [email or web address] - password [email or web address]  [facility]  Office  [phone]  CC: Primary  care physician; Esperanza Sheets, PA [facility])  Note: This dictation was prepared with Dragon dictation along with smaller phrase technology. Any transcriptional errors that result from this process are unintentional.

## 2016-03-15 NOTE — Care Management (Signed)
RNCM consult received for home health. Patient is not appropriate as she is not homebound. She states she is in between PCP and has an [facility] She states she lives with her husband and independent with mobility- drives. She states she has Medicaid and has not problem affording meds. She agrees that she is not appropriate for home health and wishes to discharge to home today. Husband will provide transportation. Case closed.

## 2016-03-15 NOTE — Discharge Instructions (Signed)

## 2016-03-16 LAB — URINE CULTURE

## 2016-03-18 DIAGNOSIS — Z720 Tobacco use: Secondary | ICD-10-CM | POA: Insufficient documentation

## 2016-03-18 DIAGNOSIS — F32A Depression, unspecified: Secondary | ICD-10-CM | POA: Insufficient documentation

## 2016-03-18 DIAGNOSIS — F419 Anxiety disorder, unspecified: Secondary | ICD-10-CM

## 2016-03-18 DIAGNOSIS — K219 Gastro-esophageal reflux disease without esophagitis: Secondary | ICD-10-CM | POA: Insufficient documentation

## 2016-03-18 DIAGNOSIS — F329 Major depressive disorder, single episode, unspecified: Secondary | ICD-10-CM | POA: Insufficient documentation

## 2016-03-20 LAB — CULTURE, BLOOD (ROUTINE X 2)
CULTURE: NO GROWTH
CULTURE: NO GROWTH

## 2016-03-31 DIAGNOSIS — R112 Nausea with vomiting, unspecified: Secondary | ICD-10-CM | POA: Insufficient documentation

## 2016-03-31 DIAGNOSIS — R1013 Epigastric pain: Secondary | ICD-10-CM | POA: Insufficient documentation

## 2016-08-11 ENCOUNTER — Emergency Department: Payer: Medicare Other

## 2016-08-11 ENCOUNTER — Emergency Department
Admission: EM | Admit: 2016-08-11 | Discharge: 2016-08-11 | Disposition: A | Discharge status: Home / Self Care | Payer: Medicare Other | Attending: Emergency Medicine | Admitting: Emergency Medicine

## 2016-08-11 DIAGNOSIS — F17210 Nicotine dependence, cigarettes, uncomplicated: Secondary | ICD-10-CM | POA: Diagnosis not present

## 2016-08-11 DIAGNOSIS — J40 Bronchitis, not specified as acute or chronic: Secondary | ICD-10-CM

## 2016-08-11 DIAGNOSIS — J449 Chronic obstructive pulmonary disease, unspecified: Secondary | ICD-10-CM | POA: Diagnosis not present

## 2016-08-11 DIAGNOSIS — R05 Cough: Secondary | ICD-10-CM | POA: Diagnosis present

## 2016-08-11 MED ORDER — PREDNISONE 10 MG PO TABS: 50.0000 mg | ORAL_TABLET | Freq: Every day | ORAL | 0 refills | Status: DC

## 2016-08-11 MED ORDER — IPRATROPIUM-ALBUTEROL 0.5-2.5 (3) MG/3ML IN SOLN
3.0000 mL | Freq: Once | RESPIRATORY_TRACT | Status: AC
  Administered 2016-08-11: 3 mL via RESPIRATORY_TRACT
  Filled 2016-08-11: qty 3

## 2016-08-11 MED ORDER — GUAIFENESIN-CODEINE 100-10 MG/5ML PO SOLN: 10.0000 mL | ORAL | 0 refills | Status: DC | PRN

## 2016-08-11 MED ORDER — AZITHROMYCIN 250 MG PO TABS: ORAL_TABLET | ORAL | 0 refills | Status: DC

## 2016-08-11 NOTE — ED Notes (Signed)
States she developed productive cough with possible over the past few days  Also having some discomfort in chest with cough and inspiration

## 2016-08-11 NOTE — ED Triage Notes (Signed)
Pt c/o cough with congestion and low grade fever since Monday. States she has been taking dayquil and nightquil

## 2016-08-11 NOTE — ED Provider Notes (Signed)
[facility] Emergency Department Provider Note  ____________________________________________  Time seen: Approximately 12:31 PM  I have reviewed the triage vital signs and the nursing notes.   HISTORY  Chief Complaint URI    HPI Destiny Simmons is a 45 y.o. female since for evaluation of cough congestion and feeling weak and rundown no energy with low-grade fever 3 days. Patient states that she's been taking over-the-counter medications with no relief. States that she has a history of COPD and has been coughing a lot.   Past Medical History:  Diagnosis Date  . Asthma   . COPD (chronic obstructive pulmonary disease) (HCC)   . Malrotation of intestine   . Sleep apnea     Patient Active Problem List   Diagnosis Date Noted  . Acute bronchitis 03/14/2016  . Somnolence 03/14/2016  . Intestinal malrotation 05/29/2015  . Non-intractable cyclical vomiting with nausea 05/29/2015  . IBS (irritable bowel syndrome) 05/29/2015    Past Surgical History:  Procedure Laterality Date  . ABDOMINAL HYSTERECTOMY    . COLONOSCOPY W/ BIOPSIES  04/02/2015   2 sessile polyps sigmoid colon  . ESOPHAGOGASTRODUODENOSCOPY ENDOSCOPY  04/02/2015   localized mildly erythomatous mucosa w/o active bleeding found in duodenal buld    Prior to Admission medications   Medication Sig Start Date End Date Taking? Authorizing Provider  albuterol (PROAIR HFA) 108 (90 Base) MCG/ACT inhaler Inhale 2 puffs into the lungs every 6 (six) hours as needed. For wheezing. 12/16/15   Historical Provider, MD  azithromycin (ZITHROMAX Z-PAK) 250 MG tablet Take 2 tablets (500 mg) on  Day 1,  followed by 1 tablet (250 mg) once daily on Days 2 through 5. 08/11/16   Charmayne Sheerharles M Beers, PA-C  clonazePAM (KLONOPIN) 1 MG tablet Take 1 mg by mouth 3 (three) times daily.    Historical Provider, MD  DULoxetine (CYMBALTA) 60 MG capsule Take 60 mg by mouth every morning.    Historical Provider, MD  etodolac (LODINE)  400 MG tablet Take 400 mg by mouth 2 (two) times daily.    Historical Provider, MD  gabapentin (NEURONTIN) 600 MG tablet Take 600 mg by mouth 3 (three) times daily. 02/20/16 02/19/17  Historical Provider, MD  guaiFENesin-codeine 100-10 MG/5ML syrup Take 10 mLs by mouth every 4 (four) hours as needed for cough. 08/11/16   Charmayne Sheerharles M Beers, PA-C  lubiprostone (AMITIZA) 24 MCG capsule Take 24 mcg by mouth 2 (two) times daily with a meal. 02/16/16   Historical Provider, MD  omeprazole (PRILOSEC) 40 MG capsule Take 40 mg by mouth daily.    Historical Provider, MD  predniSONE (DELTASONE) 10 MG tablet Take 5 tablets (50 mg total) by mouth daily with breakfast. 08/11/16   Evangeline Dakinharles M Beers, PA-C  pregabalin (LYRICA) 225 MG capsule Take 225 mg by mouth 2 (two) times daily.    Historical Provider, MD  QUEtiapine (SEROQUEL) 300 MG tablet Take 300 mg by mouth at bedtime.    Historical Provider, MD    Allergies Keflex [cephalexin]; Penicillins; Ciprofloxacin; Codeine; Dog epithelium; Lamotrigine; Meperidine; Propoxyphene; Vicodin [hydrocodone-acetaminophen]; Latex; Pollen extract; and Tape  Family History  Problem Relation Age of Onset  . Diabetes Father   . Cancer Paternal Aunt     unknown cancer  . Cancer Paternal Uncle     unknown cancer  . Heart disease Paternal Grandfather     Social History Social History  Substance Use Topics  . Smoking status: Current Every Day Smoker    Packs/day: 1.00  Types: Cigarettes  . Smokeless tobacco: Never Used  . Alcohol use No    Review of Systems Constitutional: Positive for intermittent fever/chills Eyes: No visual changes. ENT: No sore throat. Cardiovascular: Denies chest pain. Respiratory: Denies shortness of breath. Positive for coughing. Musculoskeletal: Negative for back pain. Neurological: Negative for headaches, focal weakness or numbness.  10-point ROS otherwise negative.  ____________________________________________   PHYSICAL EXAM:  VITAL  SIGNS: ED Triage Vitals  Enc Vitals Group     BP 08/11/16 1218 137/87     Pulse Rate 08/11/16 1218 (!) 102     Resp 08/11/16 1218 18     Temp 08/11/16 1218 98.4 F (36.9 C)     Temp Source 08/11/16 1218 Oral     SpO2 08/11/16 1218 97 %     Weight 08/11/16 1219 185 lb (83.9 kg)     Height 08/11/16 1219 5\' 2"  (1.575 m)     Head Circumference --      Peak Flow --      Pain Score 08/11/16 1219 6     Pain Loc --      Pain Edu? --      Excl. in GC? --     Constitutional: Alert and oriented. Well appearing and in no acute distress. Head: Atraumatic. Nose: No congestion/rhinnorhea. Mouth/Throat: Mucous membranes are moist.  Oropharynx non-erythematous. Neck: No stridor. Supple  Cardiovascular: Normal rate, regular rhythm. Grossly normal heart sounds.  Good peripheral circulation. Respiratory: Normal respiratory effort.  No retractions. Lungs decreased breath sounds bilaterally with coarse rhonchi scattered. Positive wheezing bilaterally. Musculoskeletal: No lower extremity tenderness nor edema.  No joint effusions. Neurologic:  Normal speech and language. No gross focal neurologic deficits are appreciated. No gait instability. Skin:  Skin is warm, dry and intact. No rash noted. Psychiatric: Mood and affect are normal. Speech and behavior are normal.  ____________________________________________   LABS (all labs ordered are listed, but only abnormal results are displayed)  Labs Reviewed - No data to display ____________________________________________  EKG   ____________________________________________  RADIOLOGY   No cardiopulmonary findings  ____________________________________________   PROCEDURES  Procedure(s) performed: None  Critical Care performed: No  ____________________________________________   INITIAL IMPRESSION / ASSESSMENT AND PLAN / ED COURSE  Pertinent labs & imaging results that were available during my care of the patient were reviewed by me  and considered in my medical decision making (see chart for details). Review of the [place] CSRS was performed in accordance of the [facility] prior to dispensing any controlled drugs.  Exacerbation of bronchitis. Rx given for Zithromax, Robitussin-AC, and prednisone. Patient to follow up PCP or return here with any worsening symptomology.  Clinical Course    ____________________________________________   FINAL CLINICAL IMPRESSION(S) / ED DIAGNOSES  Final diagnoses:  Bronchitis     This chart was dictated using voice recognition software/Dragon. Despite best efforts to proofread, errors can occur which can change the meaning. Any change was purely unintentional.    Evangeline Dakinharles M Beers, PA-C 08/11/16 1412    Jeanmarie PlantJames A McShane, MD 08/11/16 1550

## 2016-11-05 IMAGING — CT CT ABD-PELV W/ CM
1 of 3 series · 14 of 32 positions shown, 19 images · IV contrast (omnipaque)
Comparison: 06/26/2010

CLINICAL DATA: Chronic upper abdominal pain and nausea with
vomiting for 6 months.

EXAM:
CT ABDOMEN AND PELVIS WITH CONTRAST
TECHNIQUE: Multidetector CT imaging of the abdomen and pelvis was performed
using the standard protocol following bolus administration of
intravenous contrast.
CONTRAST:  100 cc Omnipaque 350

[Series 2: routine abd pel with · axial · 0.72mm/px · z∈[-1014,-634]mm · 14 of 86 slices shown, 19 images]
[im 5/86  soft-tissue]
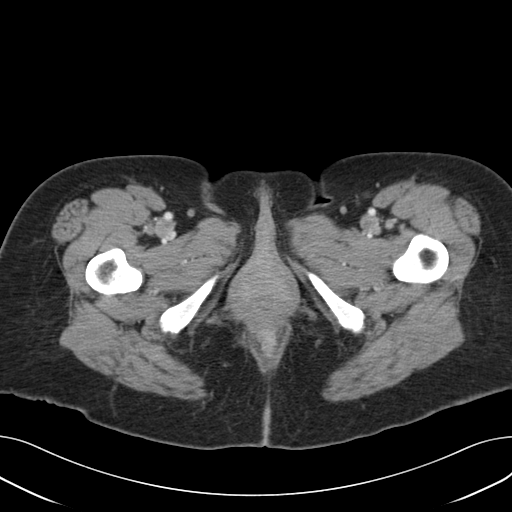
[im 5/86  bone]
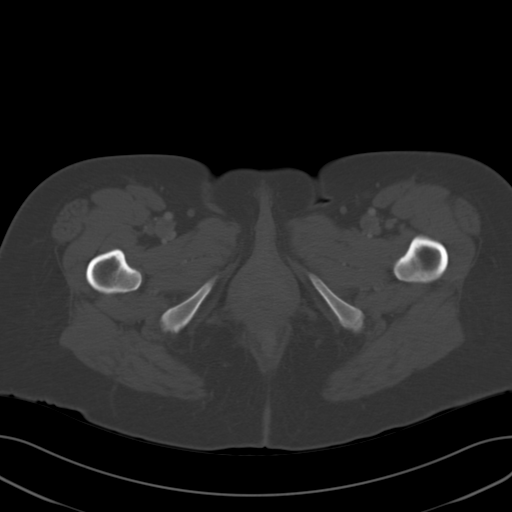
[im 14/86  soft-tissue]
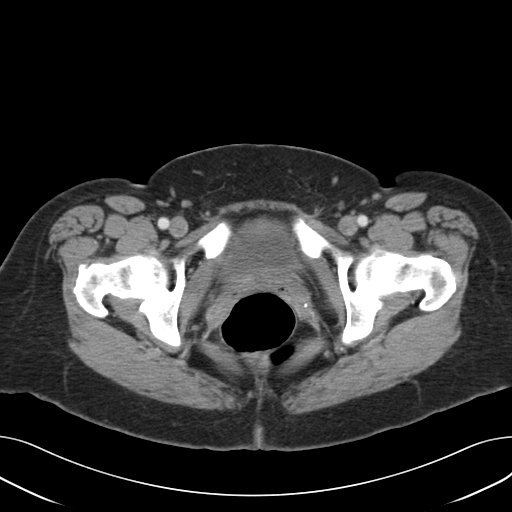
[im 18/86  soft-tissue]
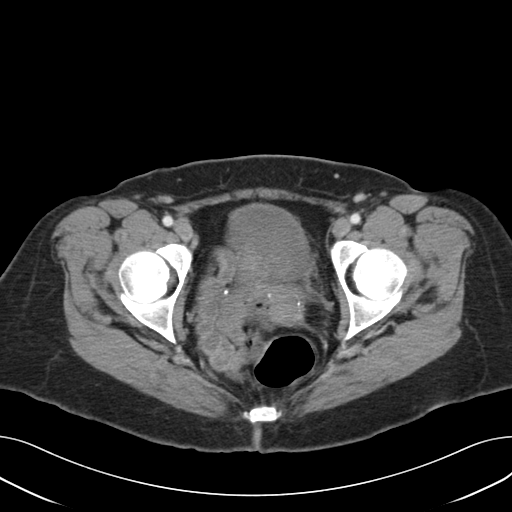
[im 23/86  soft-tissue]
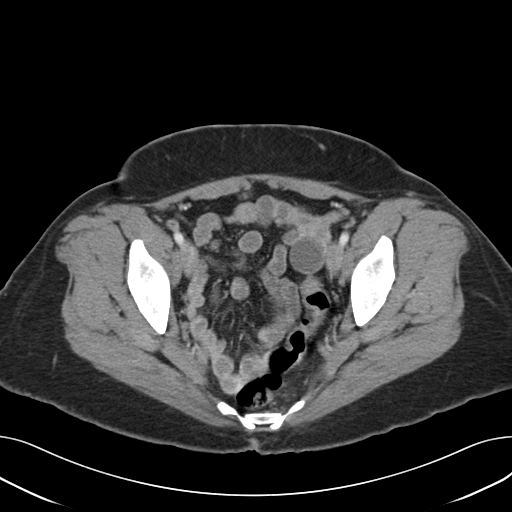
[im 32/86  soft-tissue]
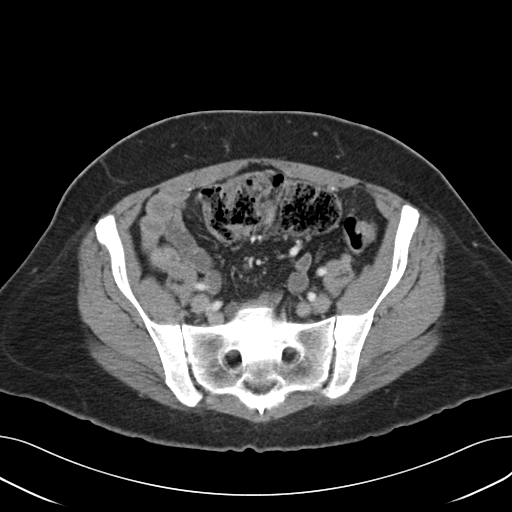
[im 36/86  soft-tissue]
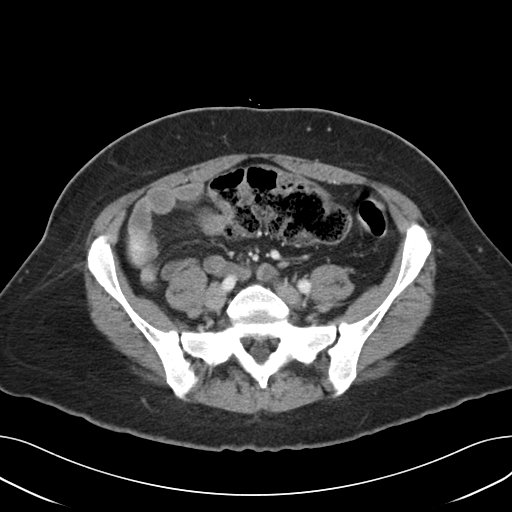
[im 45/86  soft-tissue]
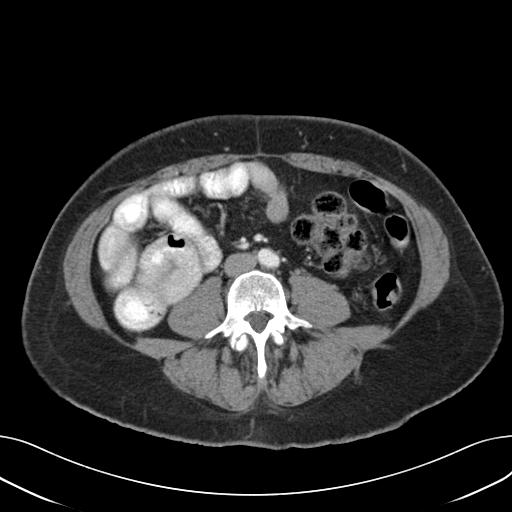
[im 50/86  soft-tissue]
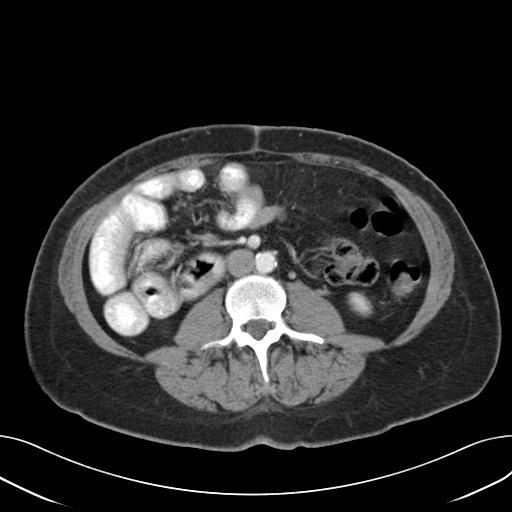
[im 54/86  soft-tissue]
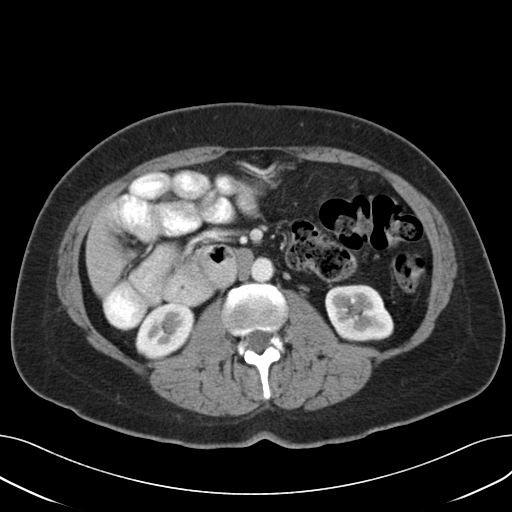
[im 54/86  bone]
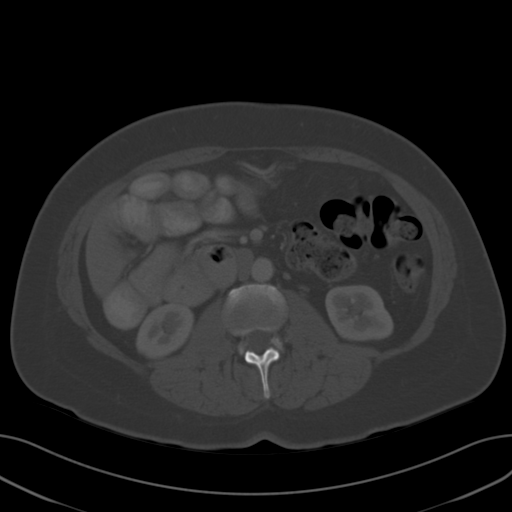
[im 63/86  soft-tissue]
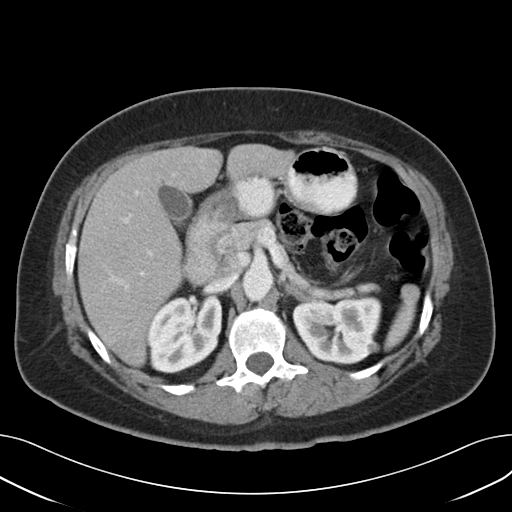
[im 68/86  soft-tissue]
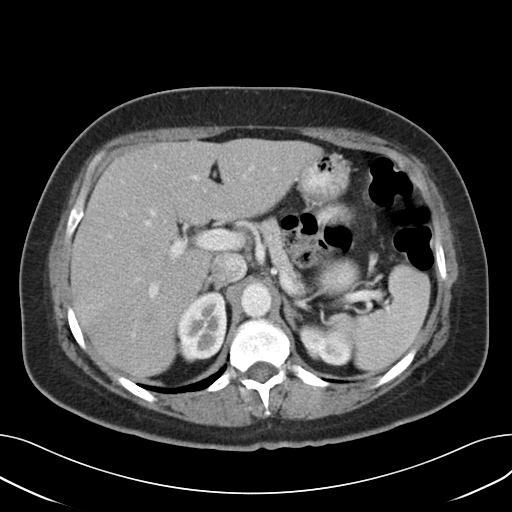
[im 68/86  lung]
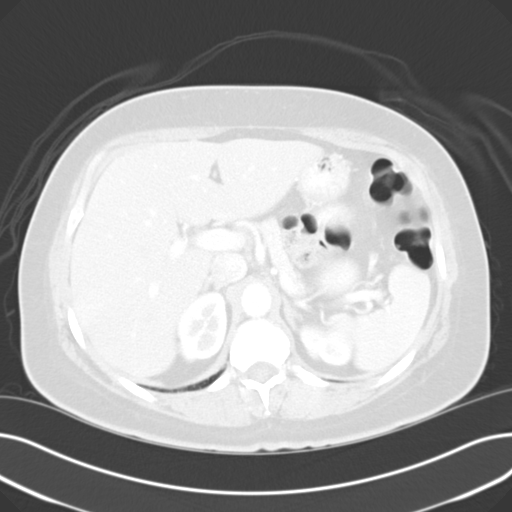
[im 72/86  soft-tissue]
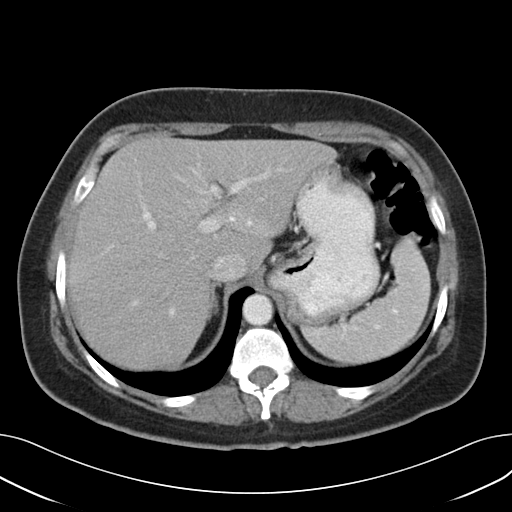
[im 72/86  lung]
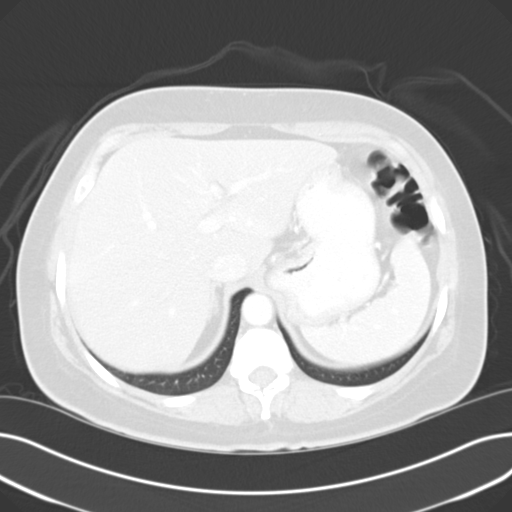
[im 77/86  lung]
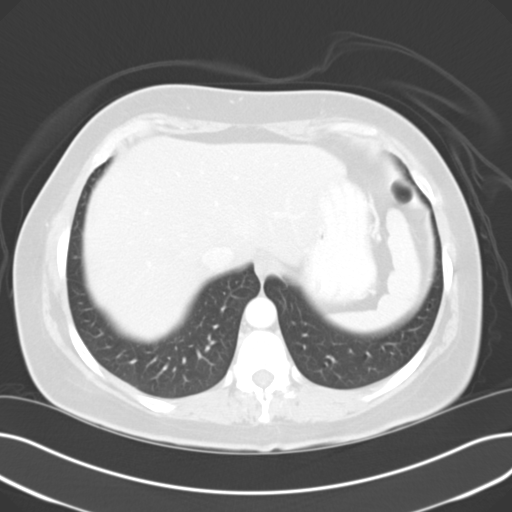
[im 81/86  soft-tissue]
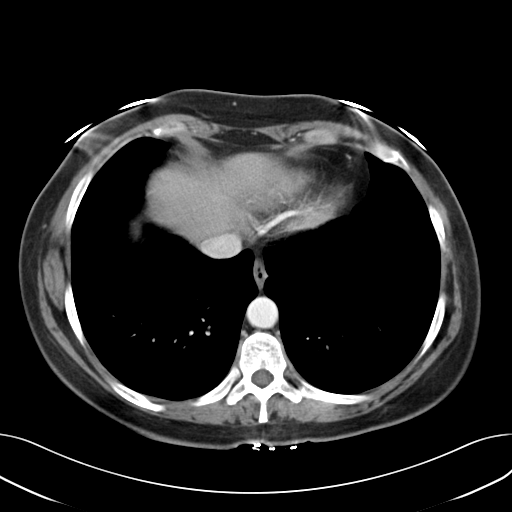
[im 81/86  lung]
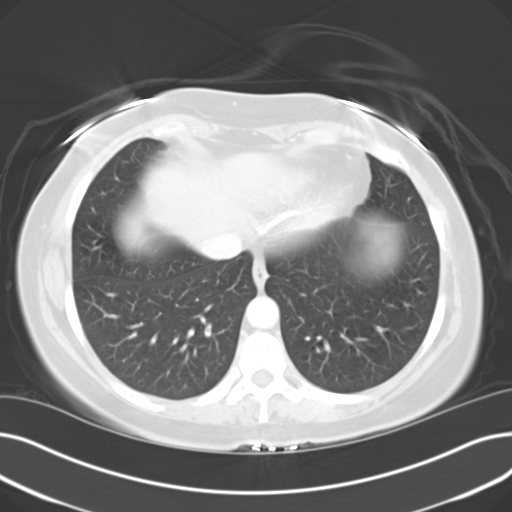

[14 of 32 positions shown; findings below may reference images not displayed]

FINDINGS: Lower chest: Clear lung bases. Normal heart size. No pericardial or
pleural effusion. No significant hiatal hernia.

Abdomen: Liver, gallbladder, biliary system, pancreas, spleen,
accessory splenule, and adrenal glands are within normal limits for
age and demonstrate no acute process.

Kidneys demonstrate normal enhancement and excretion without
obstruction or hydronephrosis. Incidental small left upper pole
cortical cyst measures 6 mm, image 24.

The small bowel is abnormally positioned. The duodenum extends
within the right abdomen and does not cross the midline compatible
with intestinal malrotation. The cecum is in the midline as result
of this. This is a chronic finding and was present in 3277. No
associated obstruction, significant ileus, or dilatation. No free
air.

No abdominal free fluid, fluid collection, hemorrhage, abscess, or
adenopathy

Minor atherosclerosis of the aorta without aneurysm or occlusive
process. No acute vascular finding.

Pelvis: Normal appendix demonstrated. Prior hysterectomy noted. No
pelvic free fluid, fluid collection, hemorrhage, abscess,
adenopathy, inguinal abnormality, or hernia. Urinary bladder is
collapsed. Small left ovarian cyst measures 2.5 x 2.5 cm, image 63.

No acute or abnormal osseous finding.
IMPRESSION: Intestinal malrotation without associated obstruction, significant
ileus, free air, or abscess.

Normal appendix

2.5 cm left ovarian cyst

Prior hysterectomy

No other acute intra-abdominal or pelvic process.

## 2017-02-03 ENCOUNTER — Encounter: Payer: Self-pay | Admitting: *Deleted

## 2017-02-03 ENCOUNTER — Emergency Department: Payer: Medicare Other

## 2017-02-03 ENCOUNTER — Emergency Department
Admission: EM | Admit: 2017-02-03 | Discharge: 2017-02-03 | Disposition: A | Discharge status: Home / Self Care | Payer: Medicare Other | Attending: Emergency Medicine | Admitting: Emergency Medicine

## 2017-02-03 DIAGNOSIS — R197 Diarrhea, unspecified: Secondary | ICD-10-CM

## 2017-02-03 DIAGNOSIS — J45909 Unspecified asthma, uncomplicated: Secondary | ICD-10-CM | POA: Insufficient documentation

## 2017-02-03 DIAGNOSIS — R072 Precordial pain: Secondary | ICD-10-CM | POA: Insufficient documentation

## 2017-02-03 DIAGNOSIS — Z79899 Other long term (current) drug therapy: Secondary | ICD-10-CM | POA: Diagnosis not present

## 2017-02-03 DIAGNOSIS — J449 Chronic obstructive pulmonary disease, unspecified: Secondary | ICD-10-CM | POA: Diagnosis not present

## 2017-02-03 DIAGNOSIS — R110 Nausea: Secondary | ICD-10-CM | POA: Diagnosis not present

## 2017-02-03 DIAGNOSIS — R05 Cough: Secondary | ICD-10-CM | POA: Diagnosis not present

## 2017-02-03 DIAGNOSIS — R079 Chest pain, unspecified: Secondary | ICD-10-CM

## 2017-02-03 DIAGNOSIS — R059 Cough, unspecified: Secondary | ICD-10-CM

## 2017-02-03 DIAGNOSIS — F17210 Nicotine dependence, cigarettes, uncomplicated: Secondary | ICD-10-CM | POA: Diagnosis not present

## 2017-02-03 LAB — URINE DRUG SCREEN, QUALITATIVE (ARMC ONLY)
Amphetamines, Ur Screen: NOT DETECTED
BARBITURATES, UR SCREEN: NOT DETECTED
Benzodiazepine, Ur Scrn: POSITIVE — AB
CANNABINOID 50 NG, UR ~~LOC~~: POSITIVE — AB
COCAINE METABOLITE, UR ~~LOC~~: NOT DETECTED
MDMA (Ecstasy)Ur Screen: NOT DETECTED
Methadone Scn, Ur: NOT DETECTED
Opiate, Ur Screen: NOT DETECTED
Phencyclidine (PCP) Ur S: NOT DETECTED
TRICYCLIC, UR SCREEN: NOT DETECTED

## 2017-02-03 LAB — BASIC METABOLIC PANEL
ANION GAP: 7 (ref 5–15)
BUN: 7 mg/dL (ref 6–20)
CHLORIDE: 106 mmol/L (ref 101–111)
CO2: 23 mmol/L (ref 22–32)
Calcium: 8.7 mg/dL — ABNORMAL LOW (ref 8.9–10.3)
Creatinine, Ser: 0.88 mg/dL (ref 0.44–1.00)
GFR calc non Af Amer: >60 mL/min (ref >60)
Glucose, Bld: 88 mg/dL (ref 65–99)
Potassium: 3.8 mmol/L (ref 3.5–5.1)
Sodium: 136 mmol/L (ref 135–145)

## 2017-02-03 LAB — CBC
HCT: 42.3 % (ref 35.0–47.0)
HEMOGLOBIN: 14.6 g/dL (ref 12.0–16.0)
MCH: 29.3 pg (ref 26.0–34.0)
MCHC: 34.5 g/dL (ref 32.0–36.0)
MCV: 84.9 fL (ref 80.0–100.0)
Platelets: 203 10*3/uL (ref 150–440)
RBC: 4.99 MIL/uL (ref 3.80–5.20)
RDW: 14.7 % — ABNORMAL HIGH (ref 11.5–14.5)
WBC: 10.3 10*3/uL (ref 3.6–11.0)

## 2017-02-03 LAB — URINALYSIS, COMPLETE (UACMP) WITH MICROSCOPIC
BACTERIA UA: NONE SEEN
Glucose, UA: NEGATIVE mg/dL
HGB URINE DIPSTICK: NEGATIVE
Ketones, ur: NEGATIVE mg/dL
LEUKOCYTES UA: NEGATIVE
Nitrite: NEGATIVE
PROTEIN: NEGATIVE mg/dL
Specific Gravity, Urine: 1.016 (ref 1.005–1.030)
pH: 5 (ref 5.0–8.0)

## 2017-02-03 LAB — TROPONIN I: Troponin I: <0.03 ng/mL (ref <0.03)

## 2017-02-03 MED ORDER — ASPIRIN 81 MG PO CHEW
324.0000 mg | CHEWABLE_TABLET | Freq: Once | ORAL | Status: AC
  Administered 2017-02-03: 324 mg via ORAL
  Filled 2017-02-03: qty 4

## 2017-02-03 MED ORDER — ONDANSETRON 4 MG PO TBDP
4.0000 mg | ORAL_TABLET | Freq: Once | ORAL | Status: AC
  Administered 2017-02-03: 4 mg via ORAL
  Filled 2017-02-03: qty 1

## 2017-02-03 MED ORDER — ONDANSETRON 4 MG PO TBDP: 4.0000 mg | ORAL_TABLET | Freq: Three times a day (TID) | ORAL | 0 refills | Status: DC | PRN

## 2017-02-03 MED ORDER — KETOROLAC TROMETHAMINE 30 MG/ML IJ SOLN
60.0000 mg | Freq: Once | INTRAMUSCULAR | Status: AC
  Administered 2017-02-03: 60 mg via INTRAMUSCULAR
  Filled 2017-02-03: qty 2

## 2017-02-03 MED ORDER — LOPERAMIDE HCL 2 MG PO TABS: 2.0000 mg | ORAL_TABLET | Freq: Four times a day (QID) | ORAL | 0 refills | Status: DC | PRN

## 2017-02-03 MED ORDER — LOPERAMIDE HCL 2 MG PO CAPS
4.0000 mg | ORAL_CAPSULE | Freq: Once | ORAL | Status: AC
  Administered 2017-02-03: 4 mg via ORAL
  Filled 2017-02-03: qty 2

## 2017-02-03 NOTE — ED Notes (Signed)
Pt ambulatory to ER via POV. Offered wheelchair, refuses. Pt alert and oriented X4, active, cooperative, pt in NAD. RR even and unlabored, color WNL.

## 2017-02-03 NOTE — ED Notes (Signed)
Pt snoring in lobby. Pt in NAD.

## 2017-02-03 NOTE — ED Notes (Signed)
Pt given water (at pt's request) for po challenge

## 2017-02-03 NOTE — ED Provider Notes (Signed)
[facility] Emergency Department Provider Note  ____________________________________________  Time seen: Approximately 1:40 PM  I have reviewed the triage vital signs and the nursing notes.   HISTORY  Chief Complaint Chest Pain and Urinary Urgency    HPI Destiny Simmons is a 46 y.o. female with a history of COPD and ongoing tobacco abuse presenting with diarrhea, body aches, nausea without vomiting, nonproductive cough, fever, and chest pain. The patient reports that for the past 2 months, every couple of days she has several seconds but up to 10 or 15 minutes of a substernal "pins and needles but much worse" sensation in the center of the chest that occasionally radiates to the left upper extremity and the left neck. This is not associated with exertion or food. She does not have associated lightheadedness or syncope, palpitations, diaphoresis, nausea or vomiting.Her last chest pain was yesterday morning. She has never had a stress test.  However, the patient is mostly presenting for 2 days of diffuse body aches, multiple episodes of nonbloody diarrhea, nausea without vomiting, and fever to 102.7 yesterday. She has had a nonproductive cough without any congestion or rhinorrhea, sore throat, or ear pain. No known sick contacts.  SH: Positive tobacco. Negative cocaine  FH: Positive for early CAD   Past Medical History:  Diagnosis Date  . Asthma   . COPD (chronic obstructive pulmonary disease) (HCC)   . Malrotation of intestine   . Sleep apnea     Patient Active Problem List   Diagnosis Date Noted  . Acute bronchitis 03/14/2016  . Somnolence 03/14/2016  . Intestinal malrotation 05/29/2015  . Non-intractable cyclical vomiting with nausea 05/29/2015  . IBS (irritable bowel syndrome) 05/29/2015    Past Surgical History:  Procedure Laterality Date  . ABDOMINAL HYSTERECTOMY    . COLONOSCOPY W/ BIOPSIES  04/02/2015   2 sessile polyps sigmoid colon  .  ESOPHAGOGASTRODUODENOSCOPY ENDOSCOPY  04/02/2015   localized mildly erythomatous mucosa w/o active bleeding found in duodenal buld    Current Outpatient Rx  . Order #: [id number] Class: Historical Med  . Order #: [id number] Class: Print  . Order #: [id number] Class: Historical Med  . Order #: [id number] Class: Historical Med  . Order #: [id number] Class: Historical Med  . Order #: [id number] Class: Historical Med  . Order #: [id number] Class: Print  . Order #: [id number] Class: Historical Med  . Order #: [id number] Class: Historical Med  . Order #: [id number] Class: Print  . Order #: [id number] Class: Historical Med  . Order #: [id number] Class: Historical Med    Allergies Keflex [cephalexin]; Penicillins; Ciprofloxacin; Codeine; Dog epithelium; Lamotrigine; Meperidine; Propoxyphene; Vicodin [hydrocodone-acetaminophen]; Latex; Pollen extract; and Tape  Family History  Problem Relation Age of Onset  . Diabetes Father   . Cancer Paternal Aunt     unknown cancer  . Cancer Paternal Uncle     unknown cancer  . Heart disease Paternal Grandfather     Social History Social History  Substance Use Topics  . Smoking status: Current Every Day Smoker    Packs/day: 1.00    Types: Cigarettes  . Smokeless tobacco: Never Used  . Alcohol use No    Review of Systems Constitutional: Positive fever. Positive myalgias. Eyes: No visual changes. ENT: No sore throat. No congestion or rhinorrhea. No ear pain. Cardiovascular: Positive chest pain. Denies palpitations. Respiratory: Denies shortness of breath.  Positive cough. Gastrointestinal: No abdominal pain.  Positive nausea, no vomiting.  Positive diarrhea.  No constipation. Genitourinary: Negative for dysuria. Musculoskeletal: Negative for back pain. Positive for neck  pain that radiates from the chest. Positive for left upper extremity pain that radiates from the chest or Skin: Negative for rash. Neurological: Negative for headaches. No focal numbness, tingling  or weakness.  Psychiatric:Depressed due to recent death of her ex-husband. 10-point ROS otherwise negative.  ____________________________________________   PHYSICAL EXAM:  VITAL SIGNS: ED Triage Vitals  Enc Vitals Group     BP 02/03/17 0947 115/76     Pulse Rate 02/03/17 0947 (!) 128     Resp 02/03/17 0947 18     Temp 02/03/17 0947 99.4 F (37.4 C)     Temp Source 02/03/17 0947 Oral     SpO2 02/03/17 0947 95 %     Weight 02/03/17 0947 175 lb (79.4 kg)     Height 02/03/17 0947 5\' 2"  (1.575 m)     Head Circumference --      Peak Flow --      Pain Score 02/03/17 0948 7     Pain Loc --      Pain Edu? --      Excl. in GC? --     Constitutional: Alert and oriented. Well appearing and in no acute distress. Answers questions appropriately. Eyes: Conjunctivae are normal.  EOMI. No scleral icterus. Head: Atraumatic. Nose: No congestion/rhinnorhea. Mouth/Throat: Mucous membranes are moist. No posterior pharyngeal erythema. No tonsillar swelling or exudate. The posterior palate is symmetric and uvula is midline. Neck: No stridor.  Supple.  No JVD. No meningismus. Cardiovascular: Normal rate, regular rhythm. No murmurs, rubs or gallops.  Respiratory: Normal respiratory effort.  No accessory muscle use or retractions. Lungs CTAB.  No wheezes, rales or ronchi. Gastrointestinal: Overweight. Soft, nontender and nondistended.  No guarding or rebound.  No peritoneal signs. Musculoskeletal: No LE edema. No ttp in the calves or palpable cords.  Negative Homan's sign. Neurologic:  A&Ox3.  Speech is clear.  Face and smile are symmetric.  EOMI.  Moves all extremities well. Skin:  Skin is warm, dry and intact. No rash noted. Psychiatric: Mood and affect are normal. Speech and behavior are normal.  Normal judgement.  ____________________________________________   LABS (all labs ordered are listed, but only abnormal results are displayed)  Labs Reviewed  BASIC METABOLIC PANEL - Abnormal;  Notable for the following:       Result Value   Calcium 8.7 (*)    All other components within normal limits  CBC - Abnormal; Notable for the following:    RDW 14.7 (*)    All other components within normal limits  URINALYSIS, COMPLETE (UACMP) WITH MICROSCOPIC - Abnormal; Notable for the following:    Color, Urine YELLOW (*)    APPearance HAZY (*)    Bilirubin Urine SMALL (*)    Squamous Epithelial / LPF 0-5 (*)    All other components within normal limits  URINE DRUG SCREEN, QUALITATIVE ([facility] ONLY) - Abnormal; Notable for the following:    Cannabinoid 50 Ng, Ur Barry POSITIVE (*)    Benzodiazepine, Ur Scrn POSITIVE (*)    All other components within normal limits  TROPONIN I   ____________________________________________  EKG  ED ECG REPORT I, Rockne Menghini, the attending physician, personally viewed and interpreted this ECG.   Date: 02/03/2017  EKG Time: 946  Rate: 123  Rhythm: sinus tachycardia  Axis: normal  Intervals:none  ST&T Change: No STEMI.  ____________________________________________  RADIOLOGY  Dg Chest 2 View  Result Date: 02/03/2017 CLINICAL DATA:  Mid and left chest pain radiating into the arm and neck.  Shortness of breath. Symptoms for 2 months. EXAM: CHEST  2 VIEW COMPARISON:  PA and lateral chest 08/11/2016 and 07/11/2015. FINDINGS: Lungs are clear. Heart size is normal. No pneumothorax or pleural effusion. Postoperative change left shoulder noted. IMPRESSION: No acute disease. Electronically Signed   By: Drusilla Kannerhomas  Dalessio M.D.   On: 02/03/2017 10:17    ____________________________________________   PROCEDURES  Procedure(s) performed: None  Procedures  Critical Care performed: No ____________________________________________   INITIAL IMPRESSION / ASSESSMENT AND PLAN / ED COURSE  Pertinent labs & imaging results that were available during my care of the patient were reviewed by me and considered in my medical decision making (see chart  for details).  46 y.o. female with COPD presenting with nausea without vomiting, diarrhea, body aches, and cough, and separately, multiple episodes of chest pain over last 2 months. For the patient's more recent symptoms, she may have influenza, flulike illness, or a GI illness. On my examination, there is no evidence suggestive of an acute surgical intra-abdominal pathology. The patient's lateral lites are reassuring, her white blood cell count is normal, and she does not have UTI. Her chest x-ray does not show pneumonia. I will plan to treat her symptomatically, and reevaluate her with likely disposition home.  For the patient's chest pain, the last episode was yesterday and her troponin today is negative. Given the onset 2 months ago, restarted medication study is warranted. I've spoken directly with Dr. Gwen PoundsKowalski, who is taken her information and will call her to set up an outpatient appointment.  ----------------------------------------- 3:16 PM on 02/03/2017 -----------------------------------------  The patient's workup in the emergency department is reassuring. Her electrolytes are normal and her white blood cell count is normal. Her urinalysis does not show UTI. At this time, the patient is feeling better, and is able to tolerate liquid. Plan discharge. She understands return precautions, as well as follow-up instructions including setting up an outpatient stress test.   ____________________________________________  FINAL CLINICAL IMPRESSION(S) / ED DIAGNOSES  Final diagnoses:  Chest pain, unspecified type  Nausea  Diarrhea, unspecified type  Cough         NEW MEDICATIONS STARTED DURING THIS VISIT:  New Prescriptions   No medications on file      Rockne MenghiniAnne-Caroline Millicent Blazejewski, MD 02/03/17 1517

## 2017-02-03 NOTE — ED Notes (Signed)
uds added on - urine needed in order to complete. Pt up to bathroom.

## 2017-02-03 NOTE — ED Notes (Signed)
Pt in NAD. Pt snoring, arousable to verbal stimuli.

## 2017-02-03 NOTE — Discharge Instructions (Signed)
For your flulike illness, please take a clear liquid diet for the next 24 hours, then advance to bland diet as tolerated. You may take Zofran for nausea and loperamide for diarrhea.  Dr. Philemon KingdomKowalski's office will contact you for an outpatient stress test evaluation.  Return to the emergency department if you develop severe pain, chest pain, lightheadedness or fainting, fever, or any other symptoms concerning to you.

## 2017-02-03 NOTE — ED Triage Notes (Addendum)
States mid chest pain that radiates to her left chest, arm, and neck, states SOB, states symptoms for 2 months, states every time she coughs she urinates a little, hx of of 4 pregnancies, states body aches, fever and states "I just feel out of it", states she smoked marijuina last night

## 2017-02-03 NOTE — ED Notes (Signed)
Pt discharged - advised not to drive since she is positive for thc and benzos and has been sleeping through most of her er visit. Pt states her husband on his way to pick her up. After being taken to the lobby to wait, was seen walking to her car to drive - advised officer and she went out to talk to her.

## 2017-03-01 ENCOUNTER — Inpatient Hospital Stay
Admission: EM | Admit: 2017-03-01 | Discharge: 2017-03-03 | DRG: 392 | Disposition: A | Discharge status: Home / Self Care | Payer: Medicare Other | Attending: Internal Medicine | Admitting: Internal Medicine

## 2017-03-01 ENCOUNTER — Encounter: Payer: Self-pay | Admitting: Emergency Medicine

## 2017-03-01 ENCOUNTER — Emergency Department: Payer: Medicare Other

## 2017-03-01 DIAGNOSIS — K219 Gastro-esophageal reflux disease without esophagitis: Secondary | ICD-10-CM | POA: Diagnosis present

## 2017-03-01 DIAGNOSIS — Z91048 Other nonmedicinal substance allergy status: Secondary | ICD-10-CM | POA: Diagnosis not present

## 2017-03-01 DIAGNOSIS — J449 Chronic obstructive pulmonary disease, unspecified: Secondary | ICD-10-CM | POA: Diagnosis present

## 2017-03-01 DIAGNOSIS — Z8249 Family history of ischemic heart disease and other diseases of the circulatory system: Secondary | ICD-10-CM

## 2017-03-01 DIAGNOSIS — F17210 Nicotine dependence, cigarettes, uncomplicated: Secondary | ICD-10-CM | POA: Diagnosis present

## 2017-03-01 DIAGNOSIS — R197 Diarrhea, unspecified: Secondary | ICD-10-CM

## 2017-03-01 DIAGNOSIS — G4730 Sleep apnea, unspecified: Secondary | ICD-10-CM | POA: Diagnosis present

## 2017-03-01 DIAGNOSIS — Z90710 Acquired absence of both cervix and uterus: Secondary | ICD-10-CM | POA: Diagnosis not present

## 2017-03-01 DIAGNOSIS — F419 Anxiety disorder, unspecified: Secondary | ICD-10-CM | POA: Diagnosis not present

## 2017-03-01 DIAGNOSIS — Z885 Allergy status to narcotic agent status: Secondary | ICD-10-CM | POA: Diagnosis not present

## 2017-03-01 DIAGNOSIS — F329 Major depressive disorder, single episode, unspecified: Secondary | ICD-10-CM | POA: Diagnosis not present

## 2017-03-01 DIAGNOSIS — Q433 Congenital malformations of intestinal fixation: Secondary | ICD-10-CM | POA: Diagnosis not present

## 2017-03-01 DIAGNOSIS — Z833 Family history of diabetes mellitus: Secondary | ICD-10-CM

## 2017-03-01 DIAGNOSIS — Z809 Family history of malignant neoplasm, unspecified: Secondary | ICD-10-CM

## 2017-03-01 DIAGNOSIS — Z7952 Long term (current) use of systemic steroids: Secondary | ICD-10-CM

## 2017-03-01 DIAGNOSIS — M1990 Unspecified osteoarthritis, unspecified site: Secondary | ICD-10-CM | POA: Diagnosis present

## 2017-03-01 DIAGNOSIS — Z880 Allergy status to penicillin: Secondary | ICD-10-CM

## 2017-03-01 DIAGNOSIS — K5190 Ulcerative colitis, unspecified, without complications: Secondary | ICD-10-CM | POA: Diagnosis present

## 2017-03-01 DIAGNOSIS — Z91040 Latex allergy status: Secondary | ICD-10-CM

## 2017-03-01 DIAGNOSIS — Z881 Allergy status to other antibiotic agents status: Secondary | ICD-10-CM

## 2017-03-01 DIAGNOSIS — K529 Noninfective gastroenteritis and colitis, unspecified: Secondary | ICD-10-CM | POA: Diagnosis present

## 2017-03-01 DIAGNOSIS — R112 Nausea with vomiting, unspecified: Secondary | ICD-10-CM

## 2017-03-01 HISTORY — DX: Unspecified osteoarthritis, unspecified site: M19.90

## 2017-03-01 LAB — CBC WITH DIFFERENTIAL/PLATELET
BASOS ABS: 0.1 10*3/uL (ref 0–0.1)
BASOS PCT: 0 %
Eosinophils Absolute: 0.1 10*3/uL (ref 0–0.7)
Eosinophils Relative: 0 %
HEMATOCRIT: 44.7 % (ref 35.0–47.0)
HEMOGLOBIN: 15 g/dL (ref 12.0–16.0)
Lymphocytes Relative: 10 %
Lymphs Abs: 2 10*3/uL (ref 1.0–3.6)
MCH: 28.8 pg (ref 26.0–34.0)
MCHC: 33.5 g/dL (ref 32.0–36.0)
MCV: 85.9 fL (ref 80.0–100.0)
Monocytes Absolute: 0.9 10*3/uL (ref 0.2–0.9)
Monocytes Relative: 4 %
NEUTROS ABS: 17.3 10*3/uL — AB (ref 1.4–6.5)
NEUTROS PCT: 86 %
Platelets: 232 10*3/uL (ref 150–440)
RBC: 5.2 MIL/uL (ref 3.80–5.20)
RDW: 14.6 % — ABNORMAL HIGH (ref 11.5–14.5)
WBC: 20.3 10*3/uL — ABNORMAL HIGH (ref 3.6–11.0)

## 2017-03-01 LAB — COMPREHENSIVE METABOLIC PANEL
ALBUMIN: 4.4 g/dL (ref 3.5–5.0)
ALK PHOS: 105 U/L (ref 38–126)
ALT: 12 U/L — ABNORMAL LOW (ref 14–54)
AST: 20 U/L (ref 15–41)
Anion gap: 12 (ref 5–15)
BILIRUBIN TOTAL: 0.8 mg/dL (ref 0.3–1.2)
BUN: 18 mg/dL (ref 6–20)
CALCIUM: 9.5 mg/dL (ref 8.9–10.3)
CO2: 20 mmol/L — AB (ref 22–32)
Chloride: 107 mmol/L (ref 101–111)
Creatinine, Ser: 0.56 mg/dL (ref 0.44–1.00)
GFR calc Af Amer: >60 mL/min (ref >60)
GFR calc non Af Amer: >60 mL/min (ref >60)
Glucose, Bld: 140 mg/dL — ABNORMAL HIGH (ref 65–99)
POTASSIUM: 3.4 mmol/L — AB (ref 3.5–5.1)
SODIUM: 139 mmol/L (ref 135–145)
TOTAL PROTEIN: 7.6 g/dL (ref 6.5–8.1)

## 2017-03-01 LAB — TROPONIN I: Troponin I: <0.03 ng/mL (ref <0.03)

## 2017-03-01 LAB — LIPASE, BLOOD: Lipase: 14 U/L (ref 11–51)

## 2017-03-01 MED ORDER — CIPROFLOXACIN IN D5W 200 MG/100ML IV SOLN
200.0000 mg | Freq: Once | INTRAVENOUS | Status: DC
  Administered 2017-03-02: 200 mg via INTRAVENOUS
  Filled 2017-03-01: qty 100

## 2017-03-01 MED ORDER — BISACODYL 10 MG RE SUPP
10.0000 mg | Freq: Once | RECTAL | Status: DC
  Filled 2017-03-01 (×2): qty 1

## 2017-03-01 MED ORDER — METOCLOPRAMIDE HCL 5 MG/ML IJ SOLN
10.0000 mg | Freq: Once | INTRAMUSCULAR | Status: AC
  Administered 2017-03-01: 10 mg via INTRAVENOUS
  Filled 2017-03-01: qty 2

## 2017-03-01 MED ORDER — FENTANYL CITRATE (PF) 100 MCG/2ML IJ SOLN
100.0000 ug | INTRAMUSCULAR | Status: DC | PRN
  Administered 2017-03-03: 100 ug via INTRAVENOUS
  Filled 2017-03-01: qty 2

## 2017-03-01 MED ORDER — PROMETHAZINE HCL 25 MG/ML IJ SOLN
12.5000 mg | Freq: Once | INTRAMUSCULAR | Status: AC
  Administered 2017-03-01: 12.5 mg via INTRAVENOUS
  Filled 2017-03-01: qty 1

## 2017-03-01 MED ORDER — SODIUM CHLORIDE 0.9 % IV BOLUS (SEPSIS)
1000.0000 mL | Freq: Once | INTRAVENOUS | Status: AC
  Administered 2017-03-01: 1000 mL via INTRAVENOUS

## 2017-03-01 MED ORDER — IOPAMIDOL (ISOVUE-300) INJECTION 61%
100.0000 mL | Freq: Once | INTRAVENOUS | Status: AC | PRN
  Administered 2017-03-01: 100 mL via INTRAVENOUS

## 2017-03-01 MED ORDER — MORPHINE SULFATE (PF) 4 MG/ML IV SOLN
4.0000 mg | INTRAVENOUS | Status: DC | PRN
  Administered 2017-03-01 (×2): 4 mg via INTRAVENOUS
  Filled 2017-03-01 (×2): qty 1

## 2017-03-01 MED ORDER — METRONIDAZOLE IN NACL 5-0.79 MG/ML-% IV SOLN
500.0000 mg | Freq: Once | INTRAVENOUS | Status: AC
  Administered 2017-03-01: 500 mg via INTRAVENOUS
  Filled 2017-03-01: qty 100

## 2017-03-01 NOTE — Progress Notes (Signed)
Spoke with MD Willy EddyPatrick Robinson and he is ok with giving Cipro dose despite drug allergy listing. Placed information for RN to watch closely and contact MD if there is a reaction

## 2017-03-01 NOTE — H&P (Signed)
History and Physical   [facility] - [place] @ [facility] Admission History and Physical [employer], D.O.    Patient Name: Destiny Simmons MR#: [id number] Date of Birth: May 22, 1971 Date of Admission: 03/01/2017  Referring MD/NP/PA: Dr. Roxan Hockey Primary Care Physician: Garry Heater, MD Patient coming from: Home Outpatient Specialists: [facility] general surgery, GI and bariatrics   Chief Complaint:  Chief Complaint  Patient presents with  . Abdominal Pain    HPI: Destiny Simmons is a 46 y.o. female with a known history of arthritis, asthma, COPD, sleep apnea, intestinal malrotation status post surgical repair 1 year ago presents to the emergency department for evaluation of abdominal pain .  Patient reports a two-month history of severe and worsening abdominal pain which originates in the epigastrium after food intake and radiates globally.. She has associated cramping, intermittent constipation and diarrhea. Today she became nauseated and had several episodes of nonbloody nonbilious vomiting.. Patient denies fevers/chills, weakness, dizziness, chest pain, shortness of breath, dysuria/frequency, changes in mental status.   Of note patient had surgery for malrotation of the small bowel one year ago and she states that her pain is similar.   Otherwise there has been no change in status. Patient has been taking medication as prescribed and there has been no recent change in medication or diet.  No recent antibiotics.  There has been no recent illness, hospitalizations, travel or sick contacts.     Review of Systems:  CONSTITUTIONAL: No fever/chills, fatigue, weakness, weight gain/loss, headache. EYES: No blurry or double vision. ENT: No tinnitus, postnasal drip, redness or soreness of the oropharynx. RESPIRATORY: No cough, dyspnea, wheeze.  No hemoptysis.  CARDIOVASCULAR: No chest pain, palpitations, syncope, orthopnea. No lower extremity edema.  GASTROINTESTINAL: Positive nausea,  vomiting, abdominal pain, negative diarrhea, constipation.  No hematemesis, melena or hematochezia. GENITOURINARY: No dysuria, frequency, hematuria. ENDOCRINE: No polyuria or nocturia. No heat or cold intolerance. HEMATOLOGY: No anemia, bruising, bleeding. INTEGUMENTARY: No rashes, ulcers, lesions. MUSCULOSKELETAL: No arthritis, gout, dyspnea. NEUROLOGIC: No numbness, tingling, ataxia, seizure-type activity, weakness. PSYCHIATRIC: No anxiety, depression, insomnia.   Past Medical History:  Diagnosis Date  . Arthritis   . Asthma   . COPD (chronic obstructive pulmonary disease) (HCC)   . Malrotation of intestine   . Sleep apnea     Past Surgical History:  Procedure Laterality Date  . ABDOMINAL HYSTERECTOMY    . ABDOMINAL SURGERY    . COLONOSCOPY W/ BIOPSIES  04/02/2015   2 sessile polyps sigmoid colon  . ESOPHAGOGASTRODUODENOSCOPY ENDOSCOPY  04/02/2015   localized mildly erythomatous mucosa w/o active bleeding found in duodenal buld     reports that she has been smoking Cigarettes.  She has been smoking about 1.00 pack per day. She has never used smokeless tobacco. She reports that she uses drugs, including Marijuana. She reports that she does not drink alcohol.  Allergies  Allergen Reactions  . Keflex [Cephalexin] Hives, Itching and Rash  . Penicillins Rash    Unknown; as a child Pt unsure of reaction, occurred when she was young  . Ciprofloxacin Hives and Itching    cipro  . Codeine Diarrhea and Nausea And Vomiting  . Dog Epithelium Itching and Cough  . Lamotrigine     Blisters  . Meperidine Nausea And Vomiting and Other (See Comments)  . Propoxyphene Nausea And Vomiting  . Vicodin [Hydrocodone-Acetaminophen] Hives, Itching, Nausea Only and Nausea And Vomiting    Itchting, nausea  . Latex Rash  . Pollen Extract Itching and Rash  .  Tape Rash    Family History  Problem Relation Age of Onset  . Diabetes Father   . Cancer Paternal Aunt     unknown cancer  . Cancer  Paternal Uncle     unknown cancer  . Heart disease Paternal Grandfather    Family history has been reviewed and confirmed with patient.   Prior to Admission medications   Medication Sig Start Date End Date Taking? Authorizing Provider  albuterol (PROAIR HFA) 108 (90 Base) MCG/ACT inhaler Inhale 2 puffs into the lungs every 6 (six) hours as needed. For wheezing. 12/16/15   Historical Provider, MD  azithromycin (ZITHROMAX Z-PAK) 250 MG tablet Take 2 tablets (500 mg) on  Day 1,  followed by 1 tablet (250 mg) once daily on Days 2 through 5. 08/11/16   Charmayne Sheer Beers, PA-C  clonazePAM (KLONOPIN) 1 MG tablet Take 1 mg by mouth 3 (three) times daily.    Historical Provider, MD  DULoxetine (CYMBALTA) 60 MG capsule Take 60 mg by mouth every morning.    Historical Provider, MD  etodolac (LODINE) 400 MG tablet Take 400 mg by mouth 2 (two) times daily.    Historical Provider, MD  gabapentin (NEURONTIN) 600 MG tablet Take 600 mg by mouth 3 (three) times daily. 02/20/16 02/19/17  Historical Provider, MD  guaiFENesin-codeine 100-10 MG/5ML syrup Take 10 mLs by mouth every 4 (four) hours as needed for cough. 08/11/16   Charmayne Sheer Beers, PA-C  loperamide (IMODIUM A-D) 2 MG tablet Take 1 tablet (2 mg total) by mouth 4 (four) times daily as needed for diarrhea or loose stools. 02/03/17   Rockne Menghini, MD  lubiprostone (AMITIZA) 24 MCG capsule Take 24 mcg by mouth 2 (two) times daily with a meal. 02/16/16   Historical Provider, MD  omeprazole (PRILOSEC) 40 MG capsule Take 40 mg by mouth daily.    Historical Provider, MD  ondansetron (ZOFRAN ODT) 4 MG disintegrating tablet Take 1 tablet (4 mg total) by mouth every 8 (eight) hours as needed for nausea or vomiting. 02/03/17   Anne-Caroline Sharma Covert, MD  predniSONE (DELTASONE) 10 MG tablet Take 5 tablets (50 mg total) by mouth daily with breakfast. 08/11/16   Evangeline Dakin, PA-C  pregabalin (LYRICA) 225 MG capsule Take 225 mg by mouth 2 (two) times daily.    Historical  Provider, MD  QUEtiapine (SEROQUEL) 300 MG tablet Take 300 mg by mouth at bedtime.    Historical Provider, MD    Physical Exam: Vitals:   03/01/17 1755 03/01/17 1800 03/01/17 1801 03/01/17 2101  BP:  (!) 134/91 (!) 147/84 128/90  Pulse:  75 79 93  Resp:  18 (!) 26 18  Temp:   98.4 F (36.9 C)   TempSrc:   Oral Oral  SpO2:  99% 99% 97%  Weight: 79.4 kg (175 lb)     Height: 5' 2.01" (1.575 m)       GENERAL: 46 y.o.-year-old White female patient, well-developed, well-nourished lying in the bed in no acute distress.  Pleasant and cooperative.   HEENT: Head atraumatic, normocephalic. Pupils equal, round, reactive to light and accommodation. No scleral icterus. Extraocular muscles intact. Nares are patent. Oropharynx is clear. Mucus membranes moist. NECK: Supple, full range of motion. No JVD, no bruit heard. No thyroid enlargement, no tenderness, no cervical lymphadenopathy. CHEST: Normal breath sounds bilaterally. No wheezing, rales, rhonchi or crackles. No use of accessory muscles of respiration.  No reproducible chest wall tenderness.  CARDIOVASCULAR: S1, S2 normal. No murmurs, rubs, or  gallops. Cap refill <2 seconds. Pulses intact distally.  ABDOMEN: Soft, nondistended, mild diffuse tenderness palpation No rebound, guarding, rigidity. Normoactive bowel sounds present in all four quadrants. No organomegaly or mass. EXTREMITIES: No pedal edema, cyanosis, or clubbing. No calf tenderness or Homan's sign.  NEUROLOGIC: The patient is alert and oriented x 3. Cranial nerves II through XII are grossly intact with no focal sensorimotor deficit. Muscle strength 5/5 in all extremities. Sensation intact. Gait not checked. PSYCHIATRIC:  Normal affect, mood, thought content. SKIN: Warm, dry, and intact without obvious rash, lesion, or ulcer.    Labs on Admission:  CBC:  Recent Labs Lab 03/01/17 1812  WBC 20.3*  NEUTROABS 17.3*  HGB 15.0  HCT 44.7  MCV 85.9  PLT 232   Basic Metabolic  Panel:  Recent Labs Lab 03/01/17 1812  NA 139  K 3.4*  CL 107  CO2 20*  GLUCOSE 140*  BUN 18  CREATININE 0.56  CALCIUM 9.5   GFR: Estimated Creatinine Clearance: 84.8 mL/min (by C-G formula based on SCr of 0.56 mg/dL). Liver Function Tests:  Recent Labs Lab 03/01/17 1812  AST 20  ALT 12*  ALKPHOS 105  BILITOT 0.8  PROT 7.6  ALBUMIN 4.4    Recent Labs Lab 03/01/17 1812  LIPASE 14   No results for input(s): AMMONIA in the last 168 hours. Coagulation Profile: No results for input(s): INR, PROTIME in the last 168 hours. Cardiac Enzymes:  Recent Labs Lab 03/01/17 1812  TROPONINI <0.03   BNP (last 3 results) No results for input(s): PROBNP in the last 8760 hours. HbA1C: No results for input(s): HGBA1C in the last 72 hours. CBG: No results for input(s): GLUCAP in the last 168 hours. Lipid Profile: No results for input(s): CHOL, HDL, LDLCALC, TRIG, CHOLHDL, LDLDIRECT in the last 72 hours. Thyroid Function Tests: No results for input(s): TSH, T4TOTAL, FREET4, T3FREE, THYROIDAB in the last 72 hours. Anemia Panel: No results for input(s): VITAMINB12, FOLATE, FERRITIN, TIBC, IRON, RETICCTPCT in the last 72 hours. Urine analysis:    Component Value Date/Time   COLORURINE YELLOW (A) 02/03/2017 0949   APPEARANCEUR HAZY (A) 02/03/2017 0949   APPEARANCEUR Cloudy 09/22/2014 1805   LABSPEC 1.016 02/03/2017 0949   LABSPEC 1.025 09/22/2014 1805   PHURINE 5.0 02/03/2017 0949   GLUCOSEU NEGATIVE 02/03/2017 0949   GLUCOSEU Negative 09/22/2014 1805   HGBUR NEGATIVE 02/03/2017 0949   BILIRUBINUR SMALL (A) 02/03/2017 0949   BILIRUBINUR Negative 09/22/2014 1805   KETONESUR NEGATIVE 02/03/2017 0949   PROTEINUR NEGATIVE 02/03/2017 0949   NITRITE NEGATIVE 02/03/2017 0949   LEUKOCYTESUR NEGATIVE 02/03/2017 0949   LEUKOCYTESUR Negative 09/22/2014 1805   Sepsis Labs: @LABRCNTIP (procalcitonin:4,lacticidven:4) )No results found for this or any previous visit (from the past  240 hour(s)).   Radiological Exams on Admission: Ct Abdomen Pelvis W Contrast  Result Date: 03/01/2017 CLINICAL DATA:  Abdominal pain over the last 2 months getting worse over the past several days. Surgery last year for malrotation. Nausea and vomiting on the way to hospital. EXAM: CT ABDOMEN AND PELVIS WITH CONTRAST TECHNIQUE: Multidetector CT imaging of the abdomen and pelvis was performed using the standard protocol following bolus administration of intravenous contrast. CONTRAST:  100mL ISOVUE-300 IOPAMIDOL (ISOVUE-300) INJECTION 61% COMPARISON:  06/09/2015 and 05/22/2015 CT FINDINGS: Lower chest: No acute abnormality. Hepatobiliary: Tiny 2 mm gallbladder polyp or gallstone along the dependent wall is suggested on current exam. Wall thickening or distention of the gallbladder. No secondary signs of acute cholecystitis. Liver enhances homogeneously. No biliary  dilatation. Pancreas: Unremarkable. No pancreatic ductal dilatation or surrounding inflammatory changes. Punctate calcification in the pancreatic tail may be vascular in etiology of or possibly from remote pancreatitis. Spleen: Normal in size without focal abnormality. Adjacent small splenule. Adrenals/Urinary Tract: Adrenal glands are unremarkable. Kidneys are normal, without renal calculi, focal lesion, or hydronephrosis. Unchanged interpolar left renal cyst measuring approximately 5 mm, too small to further characterize but without significant change. Bladder is unremarkable. Stomach/Bowel: There is malrotation of small intestine with small bowel loops noted in the right hemiabdomen and pelvis. The stomach is not distended in appearance. There is a submucosal fatty infiltration of the cecum and ascending colon which are midline in position as before. This may represent changes of chronic inflammatory bowel disease or obesity. Moderate colonic stool burden is seen along the transverse and descending colon with slight transmural thickening and subtle  pericolonic inflammation noted. A stercoral colitis is not excluded. The rectosigmoid is nondistended limiting further assessment. Cannot exclude inflammation of the rectosigmoid as a result. Vascular/Lymphatic: Aortic atherosclerosis. No enlarged abdominal or pelvic lymph nodes. Reproductive: Hysterectomy. Small cysts or follicles of the left ovary. Other: No abdominal wall hernia or abnormality. No abdominopelvic ascites. Musculoskeletal: No acute or significant osseous findings. IMPRESSION: 1. Small bowel malrotation is again noted with nonobstructed, nondistended small bowel loops in the right hemiabdomen and pelvis. 2. Prominence of submucosal fat along right colon from cecum to transverse colon, the right colon centered in the midline of the abdomen and pelvis as before. The prominent submucosal fat may represent changes of chronic inflammatory bowel disease or obesity accounting for the "Halo sign" of fat within large bowel wall. A moderate amount of fecal residue is seen within the transverse and descending colon with mild transmural thickening, findings of which may reflect changes of stercoral colitis. 3. Tiny 2 mm gallbladder polyp or gallstone seen within the gallbladder without secondary signs of cholecystitis. 4. Stable 5 mm exophytic interpolar renal hypodensity consistent with a cyst. 5. Small ovarian follicles or cysts of the left ovary. Hysterectomy. Electronically Signed   By: Tollie Eth M.D.   On: 03/01/2017 20:01    EKG: Normal sinus rhythm at 75 bpm with normal axis and nonspecific ST-T wave changes.   Assessment/Plan Active Problems:   Colitis, chronic, ulcerative (HCC)    This is a 46 y.o. female with a history of arthritis, asthma, COPD, sleep apnea, intestinal malrotation status post surgical repair 1 year ago now being admitted with:  1. Stercoral colitis, intestinal malrotation -Admit inpatient -IV Cipro and Flagyl -Follow-up stool cultures -Patient has been seen by  general surgery in the emergency department. No acute surgical intervention necessary at this time -GI consultation has been recommended and requested.  2. Intractable nausea and vomiting secondary to #1 -Antiemetics -IV fluid hydration  3. History of arthritis - Continue Lyrica, Neurontin, Lodine  4. History of asthma/COPD - Continue O2 and Med-Neb therapy if needed  5. History of GERD/IBS - Continue Prilosec, Amitiza -Hold Imodium  6. History of anxiety/depression -Continue Klonopin, Cymbalta, Seroquel  Admission status: Inpatient IV Fluids: Normal saline Diet/Nutrition: Nothing by mouth Consults called: None  DVT Px: SCDs and early ambulation. Code Status: Full Code  Disposition Plan: To home in 1-2 days   All the records are reviewed and case discussed with ED provider. Management plans discussed with the patient and/or family who express understanding and agree with plan of care.  Emanuell Morina D.O. on 03/01/2017 at 10:08 PM Between 7am to 6pm -  Pager - [phone] After 6pm go to [email or web address] - Social research officer, government [facility] [place] Office [phone] CC: Primary care physician; Garry Heater, MD   03/01/2017, 10:08 PM

## 2017-03-01 NOTE — ED Provider Notes (Signed)
[facility] Emergency Department Provider Note    First MD Initiated Contact with Patient 03/01/17 1754     (approximate)  I have reviewed the triage vital signs and the nursing notes.   HISTORY  Chief Complaint Abdominal Pain    HPI Destiny Simmons is a 46 y.o. female history of asthma presents with diffuse abdominal pain for 2 months but has gotten much worse over the past 2 days and became acutely worse today associated with nausea and vomiting. Patient had surgery last year for a rotated bowel. Denies any fevers. No shortness of breath or chest pain. States the pain starts in her mid epigastrium and radiates diffusely. Does radiate through to her back. Currently rates it as a 10 out of 10 in severity.   Past Medical History:  Diagnosis Date  . Arthritis   . Asthma   . COPD (chronic obstructive pulmonary disease) (HCC)   . Malrotation of intestine   . Sleep apnea    Family History  Problem Relation Age of Onset  . Diabetes Father   . Cancer Paternal Aunt     unknown cancer  . Cancer Paternal Uncle     unknown cancer  . Heart disease Paternal Grandfather    Past Surgical History:  Procedure Laterality Date  . ABDOMINAL HYSTERECTOMY    . ABDOMINAL SURGERY    . COLONOSCOPY W/ BIOPSIES  04/02/2015   2 sessile polyps sigmoid colon  . ESOPHAGOGASTRODUODENOSCOPY ENDOSCOPY  04/02/2015   localized mildly erythomatous mucosa w/o active bleeding found in duodenal buld   Patient Active Problem List   Diagnosis Date Noted  . Acute bronchitis 03/14/2016  . Somnolence 03/14/2016  . Intestinal malrotation 05/29/2015  . Non-intractable cyclical vomiting with nausea 05/29/2015  . IBS (irritable bowel syndrome) 05/29/2015      Prior to Admission medications   Medication Sig Start Date End Date Taking? Authorizing Provider  albuterol (PROAIR HFA) 108 (90 Base) MCG/ACT inhaler Inhale 2 puffs into the lungs every 6 (six) hours as needed. For wheezing.  12/16/15   Historical Provider, MD  azithromycin (ZITHROMAX Z-PAK) 250 MG tablet Take 2 tablets (500 mg) on  Day 1,  followed by 1 tablet (250 mg) once daily on Days 2 through 5. 08/11/16   Charmayne Sheerharles M Beers, PA-C  clonazePAM (KLONOPIN) 1 MG tablet Take 1 mg by mouth 3 (three) times daily.    Historical Provider, MD  DULoxetine (CYMBALTA) 60 MG capsule Take 60 mg by mouth every morning.    Historical Provider, MD  etodolac (LODINE) 400 MG tablet Take 400 mg by mouth 2 (two) times daily.    Historical Provider, MD  gabapentin (NEURONTIN) 600 MG tablet Take 600 mg by mouth 3 (three) times daily. 02/20/16 02/19/17  Historical Provider, MD  guaiFENesin-codeine 100-10 MG/5ML syrup Take 10 mLs by mouth every 4 (four) hours as needed for cough. 08/11/16   Charmayne Sheerharles M Beers, PA-C  loperamide (IMODIUM A-D) 2 MG tablet Take 1 tablet (2 mg total) by mouth 4 (four) times daily as needed for diarrhea or loose stools. 02/03/17   Rockne MenghiniAnne-Caroline Norman, MD  lubiprostone (AMITIZA) 24 MCG capsule Take 24 mcg by mouth 2 (two) times daily with a meal. 02/16/16   Historical Provider, MD  omeprazole (PRILOSEC) 40 MG capsule Take 40 mg by mouth daily.    Historical Provider, MD  ondansetron (ZOFRAN ODT) 4 MG disintegrating tablet Take 1 tablet (4 mg total) by mouth every 8 (eight) hours as needed for  nausea or vomiting. 02/03/17   Anne-Caroline Sharma Covert, MD  predniSONE (DELTASONE) 10 MG tablet Take 5 tablets (50 mg total) by mouth daily with breakfast. 08/11/16   Evangeline Dakin, PA-C  pregabalin (LYRICA) 225 MG capsule Take 225 mg by mouth 2 (two) times daily.    Historical Provider, MD  QUEtiapine (SEROQUEL) 300 MG tablet Take 300 mg by mouth at bedtime.    Historical Provider, MD    Allergies Keflex [cephalexin]; Penicillins; Ciprofloxacin; Codeine; Dog epithelium; Lamotrigine; Meperidine; Propoxyphene; Vicodin [hydrocodone-acetaminophen]; Latex; Pollen extract; and Tape    Social History Social History  Substance Use Topics  .  Smoking status: Current Every Day Smoker    Packs/day: 1.00    Types: Cigarettes  . Smokeless tobacco: Never Used  . Alcohol use No    Review of Systems Patient denies headaches, rhinorrhea, blurry vision, numbness, shortness of breath, chest pain, edema, cough, abdominal pain, nausea, vomiting, diarrhea, dysuria, fevers, rashes or hallucinations unless otherwise stated above in HPI. ____________________________________________   PHYSICAL EXAM:  VITAL SIGNS: Vitals:   03/01/17 1801 03/01/17 2101  BP: (!) 147/84 128/90  Pulse: 79 93  Resp: (!) 26 18  Temp: 98.4 F (36.9 C)     Constitutional: Alert and oriented. Uncomfortable appear but in no acute distress. Eyes: Conjunctivae are normal. PERRL. EOMI. Head: Atraumatic. Nose: No congestion/rhinnorhea. Mouth/Throat: Mucous membranes are moist.  Oropharynx non-erythematous. Neck: No stridor. Painless ROM. No cervical spine tenderness to palpation Hematological/Lymphatic/Immunilogical: No cervical lymphadenopathy. Cardiovascular: Normal rate, regular rhythm. Grossly normal heart sounds.  Good peripheral circulation. Respiratory: Normal respiratory effort.  No retractions. Lungs CTAB. Gastrointestinal: Soft, TTP in all four quadrants without rebound or guarding.. No distention. No abdominal bruits. No CVA tenderness. Musculoskeletal: No lower extremity tenderness nor edema.  No joint effusions. Neurologic:  Normal speech and language. No gross focal neurologic deficits are appreciated. No gait instability. Skin:  Skin is warm, dry and intact. No rash noted. Psychiatric: Mood and affect are normal. Speech and behavior are normal.  ____________________________________________   LABS (all labs ordered are listed, but only abnormal results are displayed)  Results for orders placed or performed during the hospital encounter of 03/01/17 (from the past 24 hour(s))  CBC with Differential/Platelet     Status: Abnormal   Collection  Time: 03/01/17  6:12 PM  Result Value Ref Range   WBC 20.3 (H) 3.6 - 11.0 K/uL   RBC 5.20 3.80 - 5.20 MIL/uL   Hemoglobin 15.0 12.0 - 16.0 g/dL   HCT 40.9 81.1 - 91.4 %   MCV 85.9 80.0 - 100.0 fL   MCH 28.8 26.0 - 34.0 pg   MCHC 33.5 32.0 - 36.0 g/dL   RDW 78.2 (H) 95.6 - 21.3 %   Platelets 232 150 - 440 K/uL   Neutrophils Relative % 86 %   Neutro Abs 17.3 (H) 1.4 - 6.5 K/uL   Lymphocytes Relative 10 %   Lymphs Abs 2.0 1.0 - 3.6 K/uL   Monocytes Relative 4 %   Monocytes Absolute 0.9 0.2 - 0.9 K/uL   Eosinophils Relative 0 %   Eosinophils Absolute 0.1 0 - 0.7 K/uL   Basophils Relative 0 %   Basophils Absolute 0.1 0 - 0.1 K/uL  Comprehensive metabolic panel     Status: Abnormal   Collection Time: 03/01/17  6:12 PM  Result Value Ref Range   Sodium 139 135 - 145 mmol/L   Potassium 3.4 (L) 3.5 - 5.1 mmol/L   Chloride 107 101 -  111 mmol/L   CO2 20 (L) 22 - 32 mmol/L   Glucose, Bld 140 (H) 65 - 99 mg/dL   BUN 18 6 - 20 mg/dL   Creatinine, Ser 1.61 0.44 - 1.00 mg/dL   Calcium 9.5 8.9 - 09.6 mg/dL   Total Protein 7.6 6.5 - 8.1 g/dL   Albumin 4.4 3.5 - 5.0 g/dL   AST 20 15 - 41 U/L   ALT 12 (L) 14 - 54 U/L   Alkaline Phosphatase 105 38 - 126 U/L   Total Bilirubin 0.8 0.3 - 1.2 mg/dL   GFR calc non Af Amer >60 >60 mL/min   GFR calc Af Amer >60 >60 mL/min   Anion gap 12 5 - 15  Lipase, blood     Status: None   Collection Time: 03/01/17  6:12 PM  Result Value Ref Range   Lipase 14 11 - 51 U/L  Troponin I     Status: None   Collection Time: 03/01/17  6:12 PM  Result Value Ref Range   Troponin I <0.03 <0.03 ng/mL   ____________________________________________  EKG My review and personal interpretation at Time: 17:56   Indication: abdominal pain  Rate: 75  Rhythm: sinus Axis: normal Other: no STEMI, normal intervals ____________________________________________  RADIOLOGY  I personally reviewed all radiographic images ordered to evaluate for the above acute complaints and  reviewed radiology reports and findings.  These findings were personally discussed with the patient.  Please see medical record for radiology report.  ____________________________________________   PROCEDURES  Procedure(s) performed:  Procedures    Critical Care performed: no ____________________________________________   INITIAL IMPRESSION / ASSESSMENT AND PLAN / ED COURSE  Pertinent labs & imaging results that were available during my care of the patient were reviewed by me and considered in my medical decision making (see chart for details).  DDX: sbo, enteritis, gastritis, cholecystitis, pancreatitis,  Destiny Simmons is a 46 y.o. who presents to the ED with chief complaint of abdominal pain worsening over the past several days. Patient afebrile and hemodynamic stable but does appear acutely ill. Initial clinical exam was concerning for processes such as SBO and in the setting of her acute leukocytosis and previous surgical history CT imaging ordered to evaluate for acute intra-abdominal process. CT imaging does not show any evidence of acute obstructive process but does show evidence of stercoral colitis and in the setting of her nausea vomiting and leukocytosis likely the cause of her symptoms.  Spoke with Dr. Aleen Campi of Gen surg who has reviewed CT imaging and agrees no evidence of acute obstructive process at this time. Patient was unable to be transitioned to oral medication due to persistent nausea vomiting. Based on her presentation do feel patient would benefit from continued IV fluids and IV antibiotics until can be transitioned to oral medications.      ____________________________________________   FINAL CLINICAL IMPRESSION(S) / ED DIAGNOSES  Final diagnoses:  Nausea vomiting and diarrhea  Colitis      NEW MEDICATIONS STARTED DURING THIS VISIT:  New Prescriptions   No medications on file     Note:  This document was prepared using Dragon voice  recognition software and may include unintentional dictation errors.    Willy Eddy, MD 03/01/17 2144

## 2017-03-01 NOTE — ED Triage Notes (Signed)
Pt has had abdominal pain over the last 2 months but getting worse over the last couple of days. Pt had surgery last year "rotation of bowel". NV vomited on the way to hospital with EMS and twice at home prior.

## 2017-03-01 NOTE — Consult Note (Signed)
Date of Consultation:  03/01/2017  Requesting Physician:  Willy EddyPatrick Robinson, MD  Reason for Consultation:  Abdominal pain  History of Present Illness: Destiny NapoleonCheryl C Simmons is a 46 y.o. female who presents with a 2 month history of diffuse abdominal pain which has been worsening lately.  The patient has a history of bowel malrotation and underwent a Ladd's procedure [facility].  Prior to surgery, she had been having issues with obstructive symptoms associated with pain, nausea, and vomiting.  Following surgery, the patient reports significant improvement with no pain and regular daily bowel movements.  However, this started changing again about 2 months ago.  She reports that she'll have diffuse abdominal pain, starting perhaps in the upper mid abdomen.  Sometimes the pain starts right after she eats something and had a feeling that she has to have a bowel movement, with some cramping associated.  She'll have multiple episodes like this which are short-lived.  Today however, she feels this has been one long episode all day. Her pain had become severe to cause her nausea and emesis which has been non-bilious.  The patient reports that she has to strain for each bowel movement and the stool is hard, though today she had some loose stools for the first time in a long time.  With the straining also comes cramping and pain in her abdomen.  She has been told she has IBS and about 4 years ago tried Bentyl that caused some blistering in the skin of her fingers though did improve her symptoms.  She also has a history of GERD but is not compliant with her PPI.   Past Medical History: Past Medical History:  Diagnosis Date  . Arthritis   . Asthma   . COPD (chronic obstructive pulmonary disease) (HCC)   . Malrotation of intestine   . . . Sleep apnea GERD Hx of C-difficile colitis      Past Surgical History: Past Surgical History:  Procedure Laterality Date  . Marland Kitchen. ABDOMINAL  HYSTERECTOMY C-section    . ABDOMINAL SURGERY -- Laparoscopic Ladd's procedure  02/2016  . COLONOSCOPY W/ BIOPSIES  04/02/2015   2 sessile polyps sigmoid colon  . ESOPHAGOGASTRODUODENOSCOPY ENDOSCOPY  04/02/2015   localized mildly erythomatous mucosa w/o active bleeding found in duodenal buld    Home Medications: Prior to Admission medications   Medication Sig Start Date End Date Taking? Authorizing Provider  albuterol (PROAIR HFA) 108 (90 Base) MCG/ACT inhaler Inhale 2 puffs into the lungs every 6 (six) hours as needed. For wheezing. 12/16/15   Historical Provider, MD  azithromycin (ZITHROMAX Z-PAK) 250 MG tablet Take 2 tablets (500 mg) on  Day 1,  followed by 1 tablet (250 mg) once daily on Days 2 through 5. Patient not taking: Reported on 03/01/2017 08/11/16   Charmayne Sheerharles M Beers, PA-C  clonazePAM (KLONOPIN) 1 MG tablet Take 1 mg by mouth 3 (three) times daily.    Historical Provider, MD  DULoxetine (CYMBALTA) 60 MG capsule Take 60 mg by mouth every morning.    Historical Provider, MD  etodolac (LODINE) 400 MG tablet Take 400 mg by mouth 2 (two) times daily.    Historical Provider, MD  gabapentin (NEURONTIN) 600 MG tablet Take 600 mg by mouth 3 (three) times daily. 02/20/16 02/19/17  Historical Provider, MD  guaiFENesin-codeine 100-10 MG/5ML syrup Take 10 mLs by mouth every 4 (four) hours as needed for cough. Patient not taking: Reported on 03/01/2017 08/11/16   Evangeline Dakinharles M Beers, PA-C  loperamide (IMODIUM A-D) 2 MG tablet Take 1 tablet (2 mg total) by mouth 4 (four) times daily as needed for diarrhea or loose stools. Patient not taking: Reported on 03/01/2017 02/03/17   Rockne Menghini, MD  lubiprostone (AMITIZA) 24 MCG capsule Take 24 mcg by mouth 2 (two) times daily with a meal. 02/16/16   Historical Provider, MD  omeprazole (PRILOSEC) 40 MG capsule Take 40 mg by mouth daily.    Historical Provider, MD  ondansetron (ZOFRAN ODT) 4 MG disintegrating tablet Take 1 tablet (4 mg total) by mouth every 8  (eight) hours as needed for nausea or vomiting. Patient not taking: Reported on 03/01/2017 02/03/17   Rockne Menghini, MD  predniSONE (DELTASONE) 10 MG tablet Take 5 tablets (50 mg total) by mouth daily with breakfast. Patient not taking: Reported on 03/01/2017 08/11/16   Charmayne Sheer Beers, PA-C  pregabalin (LYRICA) 225 MG capsule Take 225 mg by mouth 2 (two) times daily.    Historical Provider, MD  QUEtiapine (SEROQUEL) 300 MG tablet Take 300 mg by mouth at bedtime.    Historical Provider, MD    Allergies: Allergies  Allergen Reactions  . Keflex [Cephalexin] Hives, Itching and Rash  . Penicillins Rash    Unknown; as a child Pt unsure of reaction, occurred when she was young  . Ciprofloxacin Hives and Itching    cipro  . Codeine Diarrhea and Nausea And Vomiting  . Dog Epithelium Itching and Cough  . Lamotrigine     Blisters  . Meperidine Nausea And Vomiting and Other (See Comments)  . Propoxyphene Nausea And Vomiting  . Vicodin [Hydrocodone-Acetaminophen] Hives, Itching, Nausea Only and Nausea And Vomiting    Itchting, nausea  . Latex Rash  . Pollen Extract Itching and Rash  . Tape Rash    Social History:  reports that she has been smoking Cigarettes.  She has been smoking about 1.00 pack per day. She has never used smokeless tobacco. She reports that she uses drugs, including Marijuana. She reports that she does not drink alcohol.   Family History: Family History  Problem Relation Age of Onset  . Diabetes Father   . Cancer Paternal Aunt     unknown cancer  . Cancer Paternal Uncle     unknown cancer  . Heart disease Paternal Grandfather     Review of Systems: Review of Systems  Constitutional: Negative for chills and fever.  HENT: Negative for hearing loss.   Eyes: Negative for blurred vision.  Respiratory: Negative for cough and shortness of breath.   Cardiovascular: Negative for chest pain and leg swelling.  Gastrointestinal: Positive for abdominal pain, constipation,  nausea and vomiting. Negative for heartburn.  Genitourinary: Negative for dysuria and hematuria.  Musculoskeletal: Negative for myalgias.  Skin: Negative for rash.  Neurological: Negative for dizziness.  Psychiatric/Behavioral: Negative for depression.  All other systems reviewed and are negative.   Physical Exam BP 117/79   Pulse (!) 104   Temp 98.4 F (36.9 C) (Oral)   Resp 18   Ht 5' 2.01" (1.575 m)   Wt 79.4 kg (175 lb)   SpO2 95%   BMI 32.00 kg/m  CONSTITUTIONAL: No acute distress HEENT:  Normocephalic, atraumatic, extraocular motion intact. NECK: Trachea is midline, and there is no jugular venous distension. RESPIRATORY:  Lungs are clear, and breath sounds are equal bilaterally. Normal respiratory effort without pathologic use of accessory muscles. CARDIOVASCULAR: Heart is regular without murmurs, gallops, or rubs. GI: The abdomen is soft, non-distended, with mild tenderness  in epigastric, RUQ, LUQ, and low mid abdomen.  Patient describes her worst tenderness in the left side / LUQ area.  There were no palpable masses.  MUSCULOSKELETAL:  Normal muscle strength and tone in all four extremities.  No peripheral edema or cyanosis. SKIN: Skin turgor is normal. There are no pathologic skin lesions.  NEUROLOGIC:  Motor and sensation is grossly normal.  Cranial nerves are grossly intact. PSYCH:  Alert and oriented to person, place and time. Affect is normal.  Laboratory Analysis: Results for orders placed or performed during the hospital encounter of 03/01/17 (from the past 24 hour(s))  CBC with Differential/Platelet     Status: Abnormal   Collection Time: 03/01/17  6:12 PM  Result Value Ref Range   WBC 20.3 (H) 3.6 - 11.0 K/uL   RBC 5.20 3.80 - 5.20 MIL/uL   Hemoglobin 15.0 12.0 - 16.0 g/dL   HCT 16.1 09.6 - 04.5 %   MCV 85.9 80.0 - 100.0 fL   MCH 28.8 26.0 - 34.0 pg   MCHC 33.5 32.0 - 36.0 g/dL   RDW 40.9 (H) 81.1 - 91.4 %   Platelets 232 150 - 440 K/uL   Neutrophils  Relative % 86 %   Neutro Abs 17.3 (H) 1.4 - 6.5 K/uL   Lymphocytes Relative 10 %   Lymphs Abs 2.0 1.0 - 3.6 K/uL   Monocytes Relative 4 %   Monocytes Absolute 0.9 0.2 - 0.9 K/uL   Eosinophils Relative 0 %   Eosinophils Absolute 0.1 0 - 0.7 K/uL   Basophils Relative 0 %   Basophils Absolute 0.1 0 - 0.1 K/uL  Comprehensive metabolic panel     Status: Abnormal   Collection Time: 03/01/17  6:12 PM  Result Value Ref Range   Sodium 139 135 - 145 mmol/L   Potassium 3.4 (L) 3.5 - 5.1 mmol/L   Chloride 107 101 - 111 mmol/L   CO2 20 (L) 22 - 32 mmol/L   Glucose, Bld 140 (H) 65 - 99 mg/dL   BUN 18 6 - 20 mg/dL   Creatinine, Ser 7.82 0.44 - 1.00 mg/dL   Calcium 9.5 8.9 - 95.6 mg/dL   Total Protein 7.6 6.5 - 8.1 g/dL   Albumin 4.4 3.5 - 5.0 g/dL   AST 20 15 - 41 U/L   ALT 12 (L) 14 - 54 U/L   Alkaline Phosphatase 105 38 - 126 U/L   Total Bilirubin 0.8 0.3 - 1.2 mg/dL   GFR calc non Af Amer >60 >60 mL/min   GFR calc Af Amer >60 >60 mL/min   Anion gap 12 5 - 15  Lipase, blood     Status: None   Collection Time: 03/01/17  6:12 PM  Result Value Ref Range   Lipase 14 11 - 51 U/L  Troponin I     Status: None   Collection Time: 03/01/17  6:12 PM  Result Value Ref Range   Troponin I <0.03 <0.03 ng/mL    Imaging: Ct Abdomen Pelvis W Contrast  Result Date: 03/01/2017 CLINICAL DATA:  Abdominal pain over the last 2 months getting worse over the past several days. Surgery last year for malrotation. Nausea and vomiting on the way to hospital. EXAM: CT ABDOMEN AND PELVIS WITH CONTRAST TECHNIQUE: Multidetector CT imaging of the abdomen and pelvis was performed using the standard protocol following bolus administration of intravenous contrast. CONTRAST:  ISOVUE-300 IOPAMIDOL (ISOVUE-300) INJECTION 61% COMPARISON:  06/09/2015 and 05/22/2015 CT FINDINGS: Lower chest: No acute abnormality.  Hepatobiliary: Tiny 2 mm gallbladder polyp or gallstone along the dependent wall is suggested on current exam.  Wall thickening or distention of the gallbladder. No secondary signs of acute cholecystitis. Liver enhances homogeneously. No biliary dilatation. Pancreas: Unremarkable. No pancreatic ductal dilatation or surrounding inflammatory changes. Punctate calcification in the pancreatic tail may be vascular in etiology of or possibly from remote pancreatitis. Spleen: Normal in size without focal abnormality. Adjacent small splenule. Adrenals/Urinary Tract: Adrenal glands are unremarkable. Kidneys are normal, without renal calculi, focal lesion, or hydronephrosis. Unchanged interpolar left renal cyst measuring approximately 5 mm, too small to further characterize but without significant change. Bladder is unremarkable. Stomach/Bowel: There is malrotation of small intestine with small bowel loops noted in the right hemiabdomen and pelvis. The stomach is not distended in appearance. There is a submucosal fatty infiltration of the cecum and ascending colon which are midline in position as before. This may represent changes of chronic inflammatory bowel disease or obesity. Moderate colonic stool burden is seen along the transverse and descending colon with slight transmural thickening and subtle pericolonic inflammation noted. A stercoral colitis is not excluded. The rectosigmoid is nondistended limiting further assessment. Cannot exclude inflammation of the rectosigmoid as a result. Vascular/Lymphatic: Aortic atherosclerosis. No enlarged abdominal or pelvic lymph nodes. Reproductive: Hysterectomy. Small cysts or follicles of the left ovary. Other: No abdominal wall hernia or abnormality. No abdominopelvic ascites. Musculoskeletal: No acute or significant osseous findings. IMPRESSION: 1. Small bowel malrotation is again noted with nonobstructed, nondistended small bowel loops in the right hemiabdomen and pelvis. 2. Prominence of submucosal fat along right colon from cecum to transverse colon, the right colon centered in the  midline of the abdomen and pelvis as before. The prominent submucosal fat may represent changes of chronic inflammatory bowel disease or obesity accounting for the "Halo sign" of fat within large bowel wall. A moderate amount of fecal residue is seen within the transverse and descending colon with mild transmural thickening, findings of which may reflect changes of stercoral colitis. 3. Tiny 2 mm gallbladder polyp or gallstone seen within the gallbladder without secondary signs of cholecystitis. 4. Stable 5 mm exophytic interpolar renal hypodensity consistent with a cyst. 5. Small ovarian follicles or cysts of the left ovary. Hysterectomy. Electronically Signed   By: Tollie Eth M.D.   On: 03/01/2017 20:01    Assessment and Plan: This is a 46 y.o. female who presents with worsening abdominal pain, s/p prior Ladd's procedure for malrotation one year ago.  I have personally reviewed the patient's laboratory and imaging studies and discussed them with the patient.  There are no acute surgical needs at this time.  CT scan does not show any bowel obstruction, perforation, or abscess.  There is mild colitis of the right colon and transverse colon, and given her malrotation, fits the area of the left abdomen that the patient reports is the most tender.  This would also correlate with her elevated WBC of 20.3.  She also has symptoms of constipation which could be related to her IBS and there is a significant stool burden throughout her colon.  She also has not been compliant with her PPI and she does have a history of GERD.    Recommend obtaining GI consult for evaluation for colitis / IBS symptoms.  May consider MiraLax for her constipation.  Pain / nausea control as needed.  Again, no acute surgical needs at this time.   Howie Ill, MD [facility]

## 2017-03-02 DIAGNOSIS — K529 Noninfective gastroenteritis and colitis, unspecified: Secondary | ICD-10-CM | POA: Insufficient documentation

## 2017-03-02 LAB — COMPREHENSIVE METABOLIC PANEL
ALBUMIN: 3.6 g/dL (ref 3.5–5.0)
ALT: 10 U/L — ABNORMAL LOW (ref 14–54)
ANION GAP: 7 (ref 5–15)
AST: 13 U/L — ABNORMAL LOW (ref 15–41)
Alkaline Phosphatase: 90 U/L (ref 38–126)
BUN: 13 mg/dL (ref 6–20)
CO2: 23 mmol/L (ref 22–32)
Calcium: 8.3 mg/dL — ABNORMAL LOW (ref 8.9–10.3)
Chloride: 108 mmol/L (ref 101–111)
Creatinine, Ser: 0.5 mg/dL (ref 0.44–1.00)
GFR calc Af Amer: >60 mL/min (ref >60)
GFR calc non Af Amer: >60 mL/min (ref >60)
GLUCOSE: 119 mg/dL — AB (ref 65–99)
POTASSIUM: 3 mmol/L — AB (ref 3.5–5.1)
SODIUM: 138 mmol/L (ref 135–145)
TOTAL PROTEIN: 6.6 g/dL (ref 6.5–8.1)
Total Bilirubin: 0.8 mg/dL (ref 0.3–1.2)

## 2017-03-02 LAB — URINALYSIS, COMPLETE (UACMP) WITH MICROSCOPIC
Bacteria, UA: NONE SEEN
Bilirubin Urine: NEGATIVE
GLUCOSE, UA: NEGATIVE mg/dL
Hgb urine dipstick: NEGATIVE
Ketones, ur: 80 mg/dL — AB
Leukocytes, UA: NEGATIVE
Nitrite: NEGATIVE
PH: 5 (ref 5.0–8.0)
Protein, ur: NEGATIVE mg/dL

## 2017-03-02 LAB — CBC
HCT: 40 % (ref 35.0–47.0)
Hemoglobin: 13.2 g/dL (ref 12.0–16.0)
MCH: 28.6 pg (ref 26.0–34.0)
MCHC: 33.1 g/dL (ref 32.0–36.0)
MCV: 86.2 fL (ref 80.0–100.0)
PLATELETS: 200 10*3/uL (ref 150–440)
RBC: 4.64 MIL/uL (ref 3.80–5.20)
RDW: 14.4 % (ref 11.5–14.5)
WBC: 15.1 10*3/uL — ABNORMAL HIGH (ref 3.6–11.0)

## 2017-03-02 LAB — C DIFFICILE QUICK SCREEN W PCR REFLEX
C DIFFICILE (CDIFF) INTERP: NOT DETECTED
C Diff antigen: NEGATIVE
C Diff toxin: NEGATIVE

## 2017-03-02 MED ORDER — SODIUM CHLORIDE 0.9 % IV SOLN
30.0000 meq | Freq: Once | INTRAVENOUS | Status: AC
  Administered 2017-03-02: 30 meq via INTRAVENOUS
  Filled 2017-03-02: qty 15

## 2017-03-02 MED ORDER — ONDANSETRON HCL 4 MG/2ML IJ SOLN
4.0000 mg | Freq: Four times a day (QID) | INTRAMUSCULAR | Status: DC | PRN
  Administered 2017-03-02: 4 mg via INTRAVENOUS
  Filled 2017-03-02: qty 2

## 2017-03-02 MED ORDER — ALBUTEROL SULFATE HFA 108 (90 BASE) MCG/ACT IN AERS: 2.0000 | INHALATION_SPRAY | Freq: Four times a day (QID) | RESPIRATORY_TRACT | Status: DC | PRN

## 2017-03-02 MED ORDER — MAGNESIUM CITRATE PO SOLN
1.0000 | Freq: Once | ORAL | Status: DC | PRN
  Filled 2017-03-02: qty 296

## 2017-03-02 MED ORDER — IPRATROPIUM BROMIDE 0.02 % IN SOLN: 0.5000 mg | Freq: Four times a day (QID) | RESPIRATORY_TRACT | Status: DC | PRN

## 2017-03-02 MED ORDER — SODIUM CHLORIDE 0.9 % IV SOLN
INTRAVENOUS | Status: DC
  Administered 2017-03-02: 01:00:00 via INTRAVENOUS

## 2017-03-02 MED ORDER — BISACODYL 5 MG PO TBEC: 5.0000 mg | DELAYED_RELEASE_TABLET | Freq: Every day | ORAL | Status: DC | PRN

## 2017-03-02 MED ORDER — ETODOLAC 400 MG PO TABS
400.0000 mg | ORAL_TABLET | Freq: Two times a day (BID) | ORAL | Status: DC
  Administered 2017-03-02: 400 mg via ORAL
  Filled 2017-03-02 (×2): qty 1

## 2017-03-02 MED ORDER — GUAIFENESIN ER 600 MG PO TB12: 600.0000 mg | ORAL_TABLET | Freq: Two times a day (BID) | ORAL | Status: DC | PRN

## 2017-03-02 MED ORDER — METRONIDAZOLE IN NACL 5-0.79 MG/ML-% IV SOLN
500.0000 mg | Freq: Three times a day (TID) | INTRAVENOUS | Status: DC
  Administered 2017-03-02 – 2017-03-03 (×4): 500 mg via INTRAVENOUS
  Filled 2017-03-02 (×6): qty 100

## 2017-03-02 MED ORDER — DULOXETINE HCL 60 MG PO CPEP
60.0000 mg | ORAL_CAPSULE | ORAL | Status: DC
  Administered 2017-03-02 – 2017-03-03 (×2): 60 mg via ORAL
  Filled 2017-03-02 (×2): qty 1

## 2017-03-02 MED ORDER — CLONAZEPAM 0.5 MG PO TABS
1.0000 mg | ORAL_TABLET | Freq: Three times a day (TID) | ORAL | Status: DC
  Administered 2017-03-02 – 2017-03-03 (×4): 1 mg via ORAL
  Filled 2017-03-02 (×4): qty 2

## 2017-03-02 MED ORDER — ZOLPIDEM TARTRATE 5 MG PO TABS
5.0000 mg | ORAL_TABLET | Freq: Every evening | ORAL | Status: DC | PRN
  Administered 2017-03-02: 5 mg via ORAL
  Filled 2017-03-02 (×2): qty 1

## 2017-03-02 MED ORDER — ETODOLAC 200 MG PO CAPS
400.0000 mg | ORAL_CAPSULE | Freq: Two times a day (BID) | ORAL | Status: DC
  Administered 2017-03-02 – 2017-03-03 (×2): 400 mg via ORAL
  Filled 2017-03-02 (×2): qty 2

## 2017-03-02 MED ORDER — SENNOSIDES-DOCUSATE SODIUM 8.6-50 MG PO TABS: 1.0000 | ORAL_TABLET | Freq: Every evening | ORAL | Status: DC | PRN

## 2017-03-02 MED ORDER — QUETIAPINE FUMARATE 100 MG PO TABS
300.0000 mg | ORAL_TABLET | Freq: Every day | ORAL | Status: DC
  Administered 2017-03-02: 300 mg via ORAL
  Filled 2017-03-02: qty 3

## 2017-03-02 MED ORDER — PREGABALIN 75 MG PO CAPS
225.0000 mg | ORAL_CAPSULE | Freq: Two times a day (BID) | ORAL | Status: DC
  Administered 2017-03-02 (×3): 225 mg via ORAL
  Filled 2017-03-02 (×3): qty 3

## 2017-03-02 MED ORDER — ACETAMINOPHEN 650 MG RE SUPP: 650.0000 mg | Freq: Four times a day (QID) | RECTAL | Status: DC | PRN

## 2017-03-02 MED ORDER — ACETAMINOPHEN 325 MG PO TABS: 650.0000 mg | ORAL_TABLET | Freq: Four times a day (QID) | ORAL | Status: DC | PRN

## 2017-03-02 MED ORDER — PANTOPRAZOLE SODIUM 40 MG PO TBEC
40.0000 mg | DELAYED_RELEASE_TABLET | Freq: Every day | ORAL | Status: DC
  Administered 2017-03-02 – 2017-03-03 (×2): 40 mg via ORAL
  Filled 2017-03-02 (×2): qty 1

## 2017-03-02 MED ORDER — ALBUTEROL SULFATE (2.5 MG/3ML) 0.083% IN NEBU: 2.5000 mg | INHALATION_SOLUTION | Freq: Four times a day (QID) | RESPIRATORY_TRACT | Status: DC | PRN

## 2017-03-02 MED ORDER — CIPROFLOXACIN IN D5W 400 MG/200ML IV SOLN
400.0000 mg | Freq: Two times a day (BID) | INTRAVENOUS | Status: DC
  Administered 2017-03-02 – 2017-03-03 (×2): 400 mg via INTRAVENOUS
  Filled 2017-03-02 (×4): qty 200

## 2017-03-02 MED ORDER — MORPHINE SULFATE (PF) 2 MG/ML IV SOLN
1.0000 mg | INTRAVENOUS | Status: DC | PRN
  Administered 2017-03-02 – 2017-03-03 (×7): 1 mg via INTRAVENOUS
  Filled 2017-03-02 (×7): qty 1

## 2017-03-02 MED ORDER — POTASSIUM CHLORIDE 20 MEQ PO PACK
20.0000 meq | PACK | Freq: Once | ORAL | Status: AC
  Administered 2017-03-02: 20 meq via ORAL
  Filled 2017-03-02: qty 1

## 2017-03-02 MED ORDER — ONDANSETRON HCL 4 MG PO TABS
4.0000 mg | ORAL_TABLET | Freq: Four times a day (QID) | ORAL | Status: DC | PRN
  Administered 2017-03-02: 4 mg via ORAL
  Filled 2017-03-02: qty 1

## 2017-03-02 MED ORDER — LUBIPROSTONE 24 MCG PO CAPS
24.0000 ug | ORAL_CAPSULE | Freq: Two times a day (BID) | ORAL | Status: DC
  Administered 2017-03-02 – 2017-03-03 (×3): 24 ug via ORAL
  Filled 2017-03-02 (×5): qty 1

## 2017-03-02 NOTE — Progress Notes (Signed)
[facility] - [place] at [facility]   PATIENT NAME: Destiny Simmons    MR#:  [id number]  DATE OF BIRTH:  1971/10/07  SUBJECTIVE: Admitted for abdominal pain. Patient had history of intestinal malrotation, underwent procedure in [facility] last year. Having abdominal pain worse since yesterday /patient has chronic abdominal pain but worse yesterday, had some nausea but none today patient had diarrhea yesterday but no diarrhea today . Eager to eat .   CHIEF COMPLAINT:   Chief Complaint  Patient presents with  . Abdominal Pain    REVIEW OF SYSTEMS:    ROS  Nutrition:  Tolerating Diet: Tolerating PT:      DRUG ALLERGIES:   Allergies  Allergen Reactions  . Keflex [Cephalexin] Hives, Itching and Rash  . Penicillins Rash    Unknown; as a child Pt unsure of reaction, occurred when she was young  . Ciprofloxacin Hives and Itching    cipro  . Codeine Diarrhea and Nausea And Vomiting  . Dog Epithelium Itching and Cough  . Lamotrigine     Blisters  . Meperidine Nausea And Vomiting and Other (See Comments)  . Propoxyphene Nausea And Vomiting  . Vicodin [Hydrocodone-Acetaminophen] Hives, Itching, Nausea Only and Nausea And Vomiting    Itchting, nausea  . Latex Rash  . Pollen Extract Itching and Rash  . Tape Rash    VITALS:  Blood pressure 113/84, pulse (!) 102, temperature 98.5 F (36.9 C), temperature source Oral, resp. rate 18, height 5' 2.01" (1.575 m), weight 86.9 kg (191 lb 8 oz), SpO2 96 %.  PHYSICAL EXAMINATION:   Physical Exam  GENERAL:  46 y.o.-year-old patient lying in the bed with no acute distress.  EYES: Pupils equal, round, reactive to light and accommodation. No scleral icterus. Extraocular muscles intact.  HEENT: Head atraumatic, normocephalic. Oropharynx and nasopharynx clear.  NECK:  Supple, no jugular venous distention. No thyroid enlargement, no tenderness.  LUNGS: Normal breath sounds bilaterally, no wheezing, rales,rhonchi or  crepitation. No use of accessory muscles of respiration.  CARDIOVASCULAR: S1, S2 normal. No murmurs, rubs, or gallops.  ABDOMEN:Has minimal abdominal pain in the left lower quadrant but nontender, soft, bowel sounds present.  EXTREMITIES: No pedal edema, cyanosis, or clubbing.  NEUROLOGIC: Cranial nerves II through XII are intact. Muscle strength 5/5 in all extremities. Sensation intact. Gait not checked.  PSYCHIATRIC: The patient is alert and oriented x 3.  SKIN: No obvious rash, lesion, or ulcer.    LABORATORY PANEL:   CBC  Recent Labs Lab 03/02/17 0102  WBC 15.1*  HGB 13.2  HCT 40.0  PLT 200   ------------------------------------------------------------------------------------------------------------------  Chemistries   Recent Labs Lab 03/02/17 0102  NA 138  K 3.0*  CL 108  CO2 23  GLUCOSE 119*  BUN 13  CREATININE 0.50  CALCIUM 8.3*  AST 13*  ALT 10*  ALKPHOS 90  BILITOT 0.8   ------------------------------------------------------------------------------------------------------------------  Cardiac Enzymes  Recent Labs Lab 03/01/17 1812  TROPONINI <0.03   ------------------------------------------------------------------------------------------------------------------  RADIOLOGY:  Ct Abdomen Pelvis W Contrast  Result Date: 03/01/2017 CLINICAL DATA:  Abdominal pain over the last 2 months getting worse over the past several days. Surgery last year for malrotation. Nausea and vomiting on the way to hospital. EXAM: CT ABDOMEN AND PELVIS WITH CONTRAST TECHNIQUE: Multidetector CT imaging of the abdomen and pelvis was performed using the standard protocol following bolus administration of intravenous contrast. CONTRAST:  ISOVUE-300 IOPAMIDOL (ISOVUE-300) INJECTION 61% COMPARISON:  06/09/2015 and 05/22/2015 CT FINDINGS: Lower chest: No  acute abnormality. Hepatobiliary: Tiny 2 mm gallbladder polyp or gallstone along the dependent wall is suggested on current  exam. Wall thickening or distention of the gallbladder. No secondary signs of acute cholecystitis. Liver enhances homogeneously. No biliary dilatation. Pancreas: Unremarkable. No pancreatic ductal dilatation or surrounding inflammatory changes. Punctate calcification in the pancreatic tail may be vascular in etiology of or possibly from remote pancreatitis. Spleen: Normal in size without focal abnormality. Adjacent small splenule. Adrenals/Urinary Tract: Adrenal glands are unremarkable. Kidneys are normal, without renal calculi, focal lesion, or hydronephrosis. Unchanged interpolar left renal cyst measuring approximately 5 mm, too small to further characterize but without significant change. Bladder is unremarkable. Stomach/Bowel: There is malrotation of small intestine with small bowel loops noted in the right hemiabdomen and pelvis. The stomach is not distended in appearance. There is a submucosal fatty infiltration of the cecum and ascending colon which are midline in position as before. This may represent changes of chronic inflammatory bowel disease or obesity. Moderate colonic stool burden is seen along the transverse and descending colon with slight transmural thickening and subtle pericolonic inflammation noted. A stercoral colitis is not excluded. The rectosigmoid is nondistended limiting further assessment. Cannot exclude inflammation of the rectosigmoid as a result. Vascular/Lymphatic: Aortic atherosclerosis. No enlarged abdominal or pelvic lymph nodes. Reproductive: Hysterectomy. Small cysts or follicles of the left ovary. Other: No abdominal wall hernia or abnormality. No abdominopelvic ascites. Musculoskeletal: No acute or significant osseous findings. IMPRESSION: 1. Small bowel malrotation is again noted with nonobstructed, nondistended small bowel loops in the right hemiabdomen and pelvis. 2. Prominence of submucosal fat along right colon from cecum to transverse colon, the right colon centered in  the midline of the abdomen and pelvis as before. The prominent submucosal fat may represent changes of chronic inflammatory bowel disease or obesity accounting for the "Halo sign" of fat within large bowel wall. A moderate amount of fecal residue is seen within the transverse and descending colon with mild transmural thickening, findings of which may reflect changes of stercoral colitis. 3. Tiny 2 mm gallbladder polyp or gallstone seen within the gallbladder without secondary signs of cholecystitis. 4. Stable 5 mm exophytic interpolar renal hypodensity consistent with a cyst. 5. Small ovarian follicles or cysts of the left ovary. Hysterectomy. Electronically Signed   By: Tollie Ethavid  Kwon M.D.   On: 03/01/2017 20:01     ASSESSMENT AND PLAN:   Active Problems:   Colitis, chronic, ulcerative (HCC)   1.stercoral colitis: Improving with conservative management with IV fluids, IV antibiotics, start clear liquids today patient has minimal abdominal pain. Surgery is following, recommended GI consult but unfortunately there is no GI coverage today/ Explained this to patient. Patient will be started on clear liquids and advance to soft as tolerated, continue IV fluids, IV antibiotics, nausea medicine, pain medicines. If she feels better patient can have gastroenterology follow-up as an outpatient.  #2.diarrhoea;; resolved. D/c C. difficile precautions as patient has no further diarrhea. [email or web address]#3.intestinal  Malrotation;Patient seen by surgery, patient has chronic abdominal pain no surgical indication is for surgery, patient abdomen is soft, less checked and are less painful today watch closely and repeat studies including x-ray and CT abdomen if needed. 4 depression: Continue Cymbalta,  Seroquel, for anxiety continue Klonopin. History of irritable bowel syndrome: Continue Amitiza All the records are reviewed and case discussed with Care Management/Social Workerr. Management plans discussed with the patient, family and  they are in agreement.  CODE STATUS: Full code  TOTAL TIME TAKING CARE OF THIS  PATIENT: 35 minutes.   POSSIBLE D/C IN 1-2DAYS, DEPENDING ON CLINICAL CONDITION.   Katha Hamming M.D on 03/02/2017 at 10:05 AM  Between 7am to 6pm - Pager - [phone]  After 6pm go to [email or web address] - password [email or web address]  [facility]  Office  [phone]  CC: Primary care physician; ADAMS, Laurette Schimke, MD

## 2017-03-02 NOTE — Progress Notes (Signed)
Pts. Potassium is 3.0. Dr. Emmit PomfretHugelmeyer put in new orders for IV potassium.

## 2017-03-02 NOTE — Progress Notes (Signed)
Patient requested PRN sleep aid. Writer went to administer [place] and patient was sleeping. Will continue to monitor and endorse.

## 2017-03-02 NOTE — Progress Notes (Signed)
PHARMACY CONSULT NOTE - INITIAL   Pharmacy Consult for Electrolyte Monitoring  Indication: Hypokalemia   Allergies  Allergen Reactions  . Keflex [Cephalexin] Hives, Itching and Rash  . Penicillins Rash    Unknown; as a child Pt unsure of reaction, occurred when she was young  . Ciprofloxacin Hives and Itching    cipro  . Codeine Diarrhea and Nausea And Vomiting  . Dog Epithelium Itching and Cough  . Lamotrigine     Blisters  . Meperidine Nausea And Vomiting and Other (See Comments)  . Propoxyphene Nausea And Vomiting  . Vicodin [Hydrocodone-Acetaminophen] Hives, Itching, Nausea Only and Nausea And Vomiting    Itchting, nausea  . Latex Rash  . Pollen Extract Itching and Rash  . Tape Rash    Patient Measurements: Height: 5' 2.01" (157.5 cm) Weight: 191 lb 8 oz (86.9 kg) IBW/kg (Calculated) : 50.12   Intake/Output from previous day: 03/06 0701 - 03/07 0700 In: 736.7 [I.V.:371.7; IV Piggyback:365] Out: 400 [Urine:400] Intake/Output from this shift: Total I/O In: 100 [IV Piggyback:100] Out: -    Recent Labs  03/01/17 1812 03/02/17 0102  WBC 20.3* 15.1*  HGB 15.0 13.2  HCT 44.7 40.0  PLT 232 200  CREATININE 0.56 0.50  ALBUMIN 4.4 3.6  PROT 7.6 6.6  AST 20 13*  ALT 12* 10*  ALKPHOS 105 90  BILITOT 0.8 0.8   Estimated Creatinine Clearance: 88.9 mL/min (by C-G formula based on SCr of 0.5 mg/dL).   Microbiology: No results found for this or any previous visit (from the past 720 hour(s)).  Medical History: Past Medical History:  Diagnosis Date  . Arthritis   . Asthma   . COPD (chronic obstructive pulmonary disease) (HCC)   . Malrotation of intestine   . Sleep apnea     Assessment: 46 yo female admitted with Colitis and hypokalemia. Pharmacy consulted for electrolyte monitoring and replacement.   Lab Results  Component Value Date   K 3.0 (L) 03/02/2017  .  Goal of Therapy:  K+: 3.5-5 Mg: 1.8- 2.3  Plan:  KCL 30mEq IV x 1 ordered this morning.  Will order additional KCL 20mEq po. Will recheck K+ and Mg with am labs.   Gardner CandleSheema M Tenley Winward, PharmD, BCPS Clinical Pharmacist 03/02/2017 1:31 PM

## 2017-03-02 NOTE — Progress Notes (Signed)
Pharmacy Antibiotic Note  Destiny Simmons is a 46 y.o. female admitted on 03/01/2017 with intra-abdominal infection.  Pharmacy has been consulted for ciprofloxacin dosing.  Plan: Ciprofloxacin 400 mg IV q 12 hours ordered.  Height: 5' 2.01" (157.5 cm) Weight: 191 lb 8 oz (86.9 kg) IBW/kg (Calculated) : 50.12  Temp (24hrs), Avg:98.5 F (36.9 C), Min:98.4 F (36.9 C), Max:98.5 F (36.9 C)   Recent Labs Lab 03/01/17 1812  WBC 20.3*  CREATININE 0.56    Estimated Creatinine Clearance: 88.9 mL/min (by C-G formula based on SCr of 0.56 mg/dL).    Allergies  Allergen Reactions  . Keflex [Cephalexin] Hives, Itching and Rash  . Penicillins Rash    Unknown; as a child Pt unsure of reaction, occurred when she was young  . Ciprofloxacin Hives and Itching    cipro  . Codeine Diarrhea and Nausea And Vomiting  . Dog Epithelium Itching and Cough  . Lamotrigine     Blisters  . Meperidine Nausea And Vomiting and Other (See Comments)  . Propoxyphene Nausea And Vomiting  . Vicodin [Hydrocodone-Acetaminophen] Hives, Itching, Nausea Only and Nausea And Vomiting    Itchting, nausea  . Latex Rash  . Pollen Extract Itching and Rash  . Tape Rash    Antimicrobials this admission: cipro flagyl  3/6 >>    >>   Dose adjustments this admission:   Microbiology results: No micro    3/6 UA: pending  Thank you for allowing pharmacy to be a part of this patient's care.  Charnice Zwilling S 03/02/2017 1:01 AM

## 2017-03-02 NOTE — Plan of Care (Signed)
Problem: Education: Goal: Knowledge of [facility] Education information/materials will improve Outcome: Progressing Pt continues to c/o abd pain and reporting adequate relief with IV morphine. Pt now with regular diet and tolerating meals at this time with no N/V. Pt with 1 loose Bm today and sent for testing. Cdiff negative, and enteric precautions d/c'd. Pts VSS and continues to be A&OX4. Pt waiting to be seen by GI Mds. Will continue to monitor.

## 2017-03-03 DIAGNOSIS — K529 Noninfective gastroenteritis and colitis, unspecified: Secondary | ICD-10-CM | POA: Diagnosis not present

## 2017-03-03 LAB — CBC
HCT: 40.6 % (ref 35.0–47.0)
Hemoglobin: 13.3 g/dL (ref 12.0–16.0)
MCH: 28 pg (ref 26.0–34.0)
MCHC: 32.8 g/dL (ref 32.0–36.0)
MCV: 85.2 fL (ref 80.0–100.0)
PLATELETS: 191 10*3/uL (ref 150–440)
RBC: 4.77 MIL/uL (ref 3.80–5.20)
RDW: 15.1 % — ABNORMAL HIGH (ref 11.5–14.5)
WBC: 8.7 10*3/uL (ref 3.6–11.0)

## 2017-03-03 LAB — HIV ANTIBODY (ROUTINE TESTING W REFLEX): HIV Screen 4th Generation wRfx: NONREACTIVE

## 2017-03-03 LAB — BASIC METABOLIC PANEL
Anion gap: 5 (ref 5–15)
BUN: 10 mg/dL (ref 6–20)
CHLORIDE: 109 mmol/L (ref 101–111)
CO2: 23 mmol/L (ref 22–32)
Calcium: 8.9 mg/dL (ref 8.9–10.3)
Creatinine, Ser: 0.66 mg/dL (ref 0.44–1.00)
GFR calc Af Amer: >60 mL/min (ref >60)
GLUCOSE: 97 mg/dL (ref 65–99)
POTASSIUM: 3.6 mmol/L (ref 3.5–5.1)
Sodium: 137 mmol/L (ref 135–145)

## 2017-03-03 LAB — MAGNESIUM: Magnesium: 2.2 mg/dL (ref 1.7–2.4)

## 2017-03-03 MED ORDER — CIPROFLOXACIN HCL 500 MG PO TABS: 500.0000 mg | ORAL_TABLET | Freq: Two times a day (BID) | ORAL | 0 refills | Status: DC

## 2017-03-03 MED ORDER — METRONIDAZOLE 500 MG PO TABS: 500.0000 mg | ORAL_TABLET | Freq: Three times a day (TID) | ORAL | 0 refills | Status: DC

## 2017-03-03 NOTE — Plan of Care (Signed)
Problem: Bowel/Gastric: Goal: Will not experience complications related to bowel motility Outcome: Completed/Met Date Met: 03/03/17 Pt has met all goals for discharge.

## 2017-03-03 NOTE — Progress Notes (Signed)
Shift assessment completed at 0730. Pt is awake, was ambulating in hallway and asking to go outside, pt was told by staff she is unable to ambulate off the unit at this time. Pt had disconnected herself from her Iv, is educated about infection risk for doing this, is asked not to touch iv pump and call for staff instead. Pt is alert and oriented, stated she feels well enough to go home, and is rating abdominal pain at 4/10. Pt is on room air, lungs are clear bilat, Hr is regular, abdomen is soft, bs hypoactive. Pt is oob to bathroom prn, ppp, no edema noted. PIV #22 intact to top of l hand, site is free of redness and swelling. Srx2, call bell in reach. Pt is tolerating coke, peanut butter and crackers. Denied nausea, will order her breakfast.

## 2017-03-03 NOTE — Progress Notes (Signed)
This writer removed pt iv with catheter intact, pt tolerated well. Pt received discharge instructions, signed, and was escorted to front entrance via wc at 0950, accompanied by her daughter.

## 2017-03-03 NOTE — Progress Notes (Signed)
PHARMACY CONSULT NOTE - INITIAL   Pharmacy Consult for Electrolyte Monitoring  Indication: Hypokalemia   Allergies  Allergen Reactions  . Keflex [Cephalexin] Hives, Itching and Rash  . Penicillins Rash    Unknown; as a child Pt unsure of reaction, occurred when she was young  . Ciprofloxacin Hives and Itching    cipro  . Codeine Diarrhea and Nausea And Vomiting  . Dog Epithelium Itching and Cough  . Lamotrigine     Blisters  . Meperidine Nausea And Vomiting and Other (See Comments)  . Propoxyphene Nausea And Vomiting  . Vicodin [Hydrocodone-Acetaminophen] Hives, Itching, Nausea Only and Nausea And Vomiting    Itchting, nausea  . Latex Rash  . Pollen Extract Itching and Rash  . Tape Rash    Patient Measurements: Height: 5' 2.01" (157.5 cm) Weight: 191 lb 8 oz (86.9 kg) IBW/kg (Calculated) : 50.12 Temp: 97.7 F (36.5 C) (03/08 0400) Temp Source: Oral (03/08 0400) BP: 94/62 (03/08 0400) Pulse Rate: 91 (03/08 0400) Intake/Output from previous day: 03/07 0701 - 03/08 0700 In: 1480.8 [P.O.:220; I.V.:760.8; IV Piggyback:500] Out: -  Intake/Output from this shift: Total I/O In: 320 [P.O.:220; IV Piggyback:100] Out: -    Recent Labs  03/01/17 1812 03/02/17 0102 03/03/17 0351  WBC 20.3* 15.1* 8.7  HGB 15.0 13.2 13.3  HCT 44.7 40.0 40.6  PLT 232 200 191  CREATININE 0.56 0.50 0.66  MG  --   --  2.2  ALBUMIN 4.4 3.6  --   PROT 7.6 6.6  --   AST 20 13*  --   ALT 12* 10*  --   ALKPHOS 105 90  --   BILITOT 0.8 0.8  --    Estimated Creatinine Clearance: 88.9 mL/min (by C-G formula based on SCr of 0.66 mg/dL).   Microbiology: Recent Results (from the past 720 hour(s))  C difficile quick scan w PCR reflex     Status: None   Collection Time: 03/02/17  1:03 PM  Result Value Ref Range Status   C Diff antigen NEGATIVE NEGATIVE Final   C Diff toxin NEGATIVE NEGATIVE Final   C Diff interpretation No C. difficile detected.  Final    Medical History: Past Medical  History:  Diagnosis Date  . Arthritis   . Asthma   . COPD (chronic obstructive pulmonary disease) (HCC)   . Malrotation of intestine   . Sleep apnea     Assessment: 46 yo female admitted with Colitis and hypokalemia. Pharmacy consulted for electrolyte monitoring and replacement.   Lab Results  Component Value Date   K 3.6 03/03/2017  .  Goal of Therapy:  K+: 3.5-5 Mg: 1.8- 2.3  Plan:  KCL 30mEq IV x 1 ordered this morning. Will order additional KCL 20mEq po. Will recheck K+ and Mg with am labs.   3/8 AM electrolytes WNL. Recheck BMP tomorrow AM.  Erich MontaneMcBane,Iniya Matzek S, PharmD, BCPS Clinical Pharmacist 03/03/2017 5:50 AM

## 2017-03-03 NOTE — Progress Notes (Signed)
[facility] - [place] at [facility]   PATIENT NAME: Destiny HatchetCheryl Simmons    MR#:  [id number]  DATE OF BIRTH:  07/05/1971  Stable for discharge.   CHIEF COMPLAINT:   Chief Complaint  Patient presents with  . Abdominal Pain    REVIEW OF SYSTEMS:    ROS  Nutrition:  Tolerating Diet: Tolerating PT:      DRUG ALLERGIES:   Allergies  Allergen Reactions  . Keflex [Cephalexin] Hives, Itching and Rash  . Penicillins Rash    Unknown; as a child Pt unsure of reaction, occurred when she was young  . Ciprofloxacin Hives and Itching    cipro  . Codeine Diarrhea and Nausea And Vomiting  . Dog Epithelium Itching and Cough  . Lamotrigine     Blisters  . Meperidine Nausea And Vomiting and Other (See Comments)  . Propoxyphene Nausea And Vomiting  . Vicodin [Hydrocodone-Acetaminophen] Hives, Itching, Nausea Only and Nausea And Vomiting    Itchting, nausea  . Latex Rash  . Pollen Extract Itching and Rash  . Tape Rash    VITALS:  Blood pressure 121/81, pulse 84, temperature 97.7 F (36.5 C), temperature source Oral, resp. rate 16, height 5' 2.01" (1.575 m), weight 86.9 kg (191 lb 8 oz), SpO2 97 %.  PHYSICAL EXAMINATION:   Physical Exam  GENERAL:  46 y.o.-year-old patient lying in the bed with no acute distress.  EYES: Pupils equal, round, reactive to light and accommodation. No scleral icterus. Extraocular muscles intact.  HEENT: Head atraumatic, normocephalic. Oropharynx and nasopharynx clear.  NECK:  Supple, no jugular venous distention. No thyroid enlargement, no tenderness.  LUNGS: Normal breath sounds bilaterally, no wheezing, rales,rhonchi or crepitation. No use of accessory muscles of respiration.  CARDIOVASCULAR: S1, S2 normal. No murmurs, rubs, or gallops.  ABDOMEN:Abdomen soft, nontender, nondistended, tolerating diet.Marland Kitchen.  EXTREMITIES: No pedal edema, cyanosis, or clubbing.  NEUROLOGIC: Cranial nerves II through XII are intact. Muscle strength 5/5  in all extremities. Sensation intact. Gait not checked.  PSYCHIATRIC: The patient is alert and oriented x 3.  SKIN: No obvious rash, lesion, or ulcer.    LABORATORY PANEL:   CBC  Recent Labs Lab 03/03/17 0351  WBC 8.7  HGB 13.3  HCT 40.6  PLT 191   ------------------------------------------------------------------------------------------------------------------  Chemistries   Recent Labs Lab 03/02/17 0102 03/03/17 0351  NA 138 137  K 3.0* 3.6  CL 108 109  CO2 23 23  GLUCOSE 119* 97  BUN 13 10  CREATININE 0.50 0.66  CALCIUM 8.3* 8.9  MG  --  2.2  AST 13*  --   ALT 10*  --   ALKPHOS 90  --   BILITOT 0.8  --    ------------------------------------------------------------------------------------------------------------------  Cardiac Enzymes  Recent Labs Lab 03/01/17 1812  TROPONINI <0.03   ------------------------------------------------------------------------------------------------------------------  RADIOLOGY:  Ct Abdomen Pelvis W Contrast  Result Date: 03/01/2017 CLINICAL DATA:  Abdominal pain over the last 2 months getting worse over the past several days. Surgery last year for malrotation. Nausea and vomiting on the way to hospital. EXAM: CT ABDOMEN AND PELVIS WITH CONTRAST TECHNIQUE: Multidetector CT imaging of the abdomen and pelvis was performed using the standard protocol following bolus administration of intravenous contrast. CONTRAST:  100mL ISOVUE-300 IOPAMIDOL (ISOVUE-300) INJECTION 61% COMPARISON:  06/09/2015 and 05/22/2015 CT FINDINGS: Lower chest: No acute abnormality. Hepatobiliary: Tiny 2 mm gallbladder polyp or gallstone along the dependent wall is suggested on current exam. Wall thickening or distention of the gallbladder. No secondary signs  of acute cholecystitis. Liver enhances homogeneously. No biliary dilatation. Pancreas: Unremarkable. No pancreatic ductal dilatation or surrounding inflammatory changes. Punctate calcification in the  pancreatic tail may be vascular in etiology of or possibly from remote pancreatitis. Spleen: Normal in size without focal abnormality. Adjacent small splenule. Adrenals/Urinary Tract: Adrenal glands are unremarkable. Kidneys are normal, without renal calculi, focal lesion, or hydronephrosis. Unchanged interpolar left renal cyst measuring approximately 5 mm, too small to further characterize but without significant change. Bladder is unremarkable. Stomach/Bowel: There is malrotation of small intestine with small bowel loops noted in the right hemiabdomen and pelvis. The stomach is not distended in appearance. There is a submucosal fatty infiltration of the cecum and ascending colon which are midline in position as before. This may represent changes of chronic inflammatory bowel disease or obesity. Moderate colonic stool burden is seen along the transverse and descending colon with slight transmural thickening and subtle pericolonic inflammation noted. A stercoral colitis is not excluded. The rectosigmoid is nondistended limiting further assessment. Cannot exclude inflammation of the rectosigmoid as a result. Vascular/Lymphatic: Aortic atherosclerosis. No enlarged abdominal or pelvic lymph nodes. Reproductive: Hysterectomy. Small cysts or follicles of the left ovary. Other: No abdominal wall hernia or abnormality. No abdominopelvic ascites. Musculoskeletal: No acute or significant osseous findings. IMPRESSION: 1. Small bowel malrotation is again noted with nonobstructed, nondistended small bowel loops in the right hemiabdomen and pelvis. 2. Prominence of submucosal fat along right colon from cecum to transverse colon, the right colon centered in the midline of the abdomen and pelvis as before. The prominent submucosal fat may represent changes of chronic inflammatory bowel disease or obesity accounting for the "Halo sign" of fat within large bowel wall. A moderate amount of fecal residue is seen within the transverse  and descending colon with mild transmural thickening, findings of which may reflect changes of stercoral colitis. 3. Tiny 2 mm gallbladder polyp or gallstone seen within the gallbladder without secondary signs of cholecystitis. 4. Stable 5 mm exophytic interpolar renal hypodensity consistent with a cyst. 5. Small ovarian follicles or cysts of the left ovary. Hysterectomy. Electronically Signed   By: Tollie Eth M.D.   On: 03/01/2017 20:01     ASSESSMENT AND PLAN:   Active Problems:   Colitis, chronic, ulcerative (HCC)   1.stercoral colitis: Improving with conservative   t. Patient tolerating the diet, discharged home today, follow up with GI at [facility] Discharged on with Cipro, Flagyl. #2.diarrhoea;; resolved. D/c C. difficile precautions as patient has no further diarrhea. [email or web address]  Malrotation;Patient seen by surgery, patient has chronic abdominal pain no surgical indication is for surgery, patient abdomen is soft, no abdominal pain today. 4 depression: Continue Cymbalta,  Seroquel, for anxiety continue Klonopin. History of irritable bowel syndrome: Continue Amitiza Discharge home today,  All the records are reviewed and case discussed with Care Management/Social Workerr. Management plans discussed with the patient, family and they are in agreement.  CODE STATUS: Full code  TOTAL TIME TAKING CARE OF THIS PATIENT: 35 minutes.   Katha Hamming M.D on 03/03/2017 at 2:23 PM  Between 7am to 6pm - Pager - [phone]  After 6pm go to [email or web address] - password [email or web address]  [place] [facility]  Office  [phone]  CC: Primary care physician; ADAMS, Laurette Schimke, MD

## 2017-03-04 NOTE — Discharge Summary (Signed)
Destiny Simmons, is a 46 y.o. female  DOB January 31, 1971  MRN [id number].  Admission date:  03/01/2017  Admitting Physician  Tonye Royalty, DO  Discharge Date:  03/04/2017   Primary MD  ADAMS, Laurette Schimke, MD  Recommendations for primary care physician for things to follow:   Follow with [facility] in one week.   Admission Diagnosis  Colitis [K52.9] Nausea vomiting and diarrhea [R11.2, R19.7]   Discharge Diagnosis  Colitis [K52.9] Nausea vomiting and diarrhea [R11.2, R19.7]    Active Problems:   Colitis, chronic, ulcerative (HCC)      Past Medical History:  Diagnosis Date  . Arthritis   . Asthma   . COPD (chronic obstructive pulmonary disease) (HCC)   . Malrotation of intestine   . Sleep apnea     Past Surgical History:  Procedure Laterality Date  . ABDOMINAL HYSTERECTOMY    . ABDOMINAL SURGERY    . COLONOSCOPY W/ BIOPSIES  04/02/2015   2 sessile polyps sigmoid colon  . ESOPHAGOGASTRODUODENOSCOPY ENDOSCOPY  04/02/2015   localized mildly erythomatous mucosa w/o active bleeding found in duodenal buld       History of present illness and  Hospital Course:     Kindly see H&P for history of present illness and admission details, please review complete Labs, Consult reports and Test reports for all details in brief  HPI  from the history and physical done on the day of admission  Destiny Simmons is a 46 y.o. female with a known history of arthritis, asthma, COPD, sleep apnea, intestinal malrotation status post surgical repair 1 year ago presents to the emergency department for evaluation of abdominal pain .  Patient reports a two-month history of severe and worsening abdominal pain which originates in the epigastrium after food intake and radiates globally.. She has associated cramping, intermittent constipation and  diarrhea. Also had nausuea.  Hospital Course  .stercoral colitis: Improved with conservative treatment.started on cipro and flagyl.discharged home with cipro and flagyl.tolerated diet next day.   , follow up with GI at [facility].,  #2.diarrhoea;; resolved. D/c ed C. difficile precautions as patient has no further diarrhea. [email or web address]  Malrotation;Patient seen by surgery, patient has chronic abdominal pain ,no surgical indication is for surgery, abdominal  Pain improved after treating colitis.Marland Kitchen 4 depression: Continue Cymbalta,  Seroquel, for anxiety continue Klonopin. History of irritable bowel syndrome: Continue Amitiza    Discharge Condition: stable   Follow UP  Follow-up Information    ADAMS, Laurette Schimke, MD Follow up in 1 week(s).   Specialty:  Family Medicine Contact information: [address] [place] [address] [phone]        [facility]) Follow up in 1 week(s).             Discharge Instructions  and  Discharge Medications     Allergies as of 03/03/2017      Reactions   Keflex [cephalexin] Hives, Itching, Rash   Penicillins Rash   Unknown; as a child Pt unsure of reaction, occurred when she was young   Ciprofloxacin Hives, Itching   cipro   Codeine Diarrhea, Nausea And Vomiting   Dog Epithelium Itching, Cough   Lamotrigine    Blisters   Meperidine Nausea And Vomiting, Other (See Comments)   Propoxyphene Nausea And Vomiting   Vicodin [hydrocodone-acetaminophen] Hives, Itching, Nausea Only, Nausea And Vomiting   Itchting, nausea   Latex Rash   Pollen Extract Itching, Rash   Tape Rash  Medication List    STOP taking these medications   azithromycin 250 MG tablet Commonly known as:  ZITHROMAX Z-PAK   guaiFENesin-codeine 100-10 MG/5ML syrup   loperamide 2 MG tablet Commonly known as:  IMODIUM A-D   predniSONE 10 MG tablet Commonly known as:  DELTASONE     TAKE these medications   AMITIZA 24 MCG capsule Generic  drug:  lubiprostone Take 24 mcg by mouth 2 (two) times daily with a meal.   ciprofloxacin 500 MG tablet Commonly known as:  CIPRO Take 1 tablet (500 mg total) by mouth 2 (two) times daily.   clonazePAM 1 MG tablet Commonly known as:  KLONOPIN Take 1 mg by mouth 3 (three) times daily.   DULoxetine 60 MG capsule Commonly known as:  CYMBALTA Take 60 mg by mouth every morning.   etodolac 400 MG tablet Commonly known as:  LODINE Take 400 mg by mouth 2 (two) times daily.   gabapentin 600 MG tablet Commonly known as:  NEURONTIN Take 600 mg by mouth 3 (three) times daily.   metroNIDAZOLE 500 MG tablet Commonly known as:  FLAGYL Take 1 tablet (500 mg total) by mouth 3 (three) times daily.   omeprazole 40 MG capsule Commonly known as:  PRILOSEC Take 40 mg by mouth daily.   ondansetron 4 MG disintegrating tablet Commonly known as:  ZOFRAN ODT Take 1 tablet (4 mg total) by mouth every 8 (eight) hours as needed for nausea or vomiting.   pregabalin 225 MG capsule Commonly known as:  LYRICA Take 225 mg by mouth 2 (two) times daily.   PROAIR HFA 108 (90 Base) MCG/ACT inhaler Generic drug:  albuterol Inhale 2 puffs into the lungs every 6 (six) hours as needed. For wheezing.   QUEtiapine 300 MG tablet Commonly known as:  SEROQUEL Take 300 mg by mouth at bedtime.         Diet and Activity recommendation: See Discharge Instructions above   Consults obtained - none   Major procedures and Radiology Reports - PLEASE review detailed and final reports for all details, in brief -     Dg Chest 2 View  Result Date: 02/03/2017 CLINICAL DATA:  Mid and left chest pain radiating into the arm and neck. Shortness of breath. Symptoms for 2 months. EXAM: CHEST  2 VIEW COMPARISON:  PA and lateral chest 08/11/2016 and 07/11/2015. FINDINGS: Lungs are clear. Heart size is normal. No pneumothorax or pleural effusion. Postoperative change left shoulder noted. IMPRESSION: No acute disease.  Electronically Signed   By: Drusilla Kanner M.D.   On: 02/03/2017 10:17   Ct Abdomen Pelvis W Contrast  Result Date: 03/01/2017 CLINICAL DATA:  Abdominal pain over the last 2 months getting worse over the past several days. Surgery last year for malrotation. Nausea and vomiting on the way to hospital. EXAM: CT ABDOMEN AND PELVIS WITH CONTRAST TECHNIQUE: Multidetector CT imaging of the abdomen and pelvis was performed using the standard protocol following bolus administration of intravenous contrast. CONTRAST:  ISOVUE-300 IOPAMIDOL (ISOVUE-300) INJECTION 61% COMPARISON:  06/09/2015 and 05/22/2015 CT FINDINGS: Lower chest: No acute abnormality. Hepatobiliary: Tiny 2 mm gallbladder polyp or gallstone along the dependent wall is suggested on current exam. Wall thickening or distention of the gallbladder. No secondary signs of acute cholecystitis. Liver enhances homogeneously. No biliary dilatation. Pancreas: Unremarkable. No pancreatic ductal dilatation or surrounding inflammatory changes. Punctate calcification in the pancreatic tail may be vascular in etiology of or possibly from remote pancreatitis. Spleen: Normal in size without  focal abnormality. Adjacent small splenule. Adrenals/Urinary Tract: Adrenal glands are unremarkable. Kidneys are normal, without renal calculi, focal lesion, or hydronephrosis. Unchanged interpolar left renal cyst measuring approximately 5 mm, too small to further characterize but without significant change. Bladder is unremarkable. Stomach/Bowel: There is malrotation of small intestine with small bowel loops noted in the right hemiabdomen and pelvis. The stomach is not distended in appearance. There is a submucosal fatty infiltration of the cecum and ascending colon which are midline in position as before. This may represent changes of chronic inflammatory bowel disease or obesity. Moderate colonic stool burden is seen along the transverse and descending colon with slight  transmural thickening and subtle pericolonic inflammation noted. A stercoral colitis is not excluded. The rectosigmoid is nondistended limiting further assessment. Cannot exclude inflammation of the rectosigmoid as a result. Vascular/Lymphatic: Aortic atherosclerosis. No enlarged abdominal or pelvic lymph nodes. Reproductive: Hysterectomy. Small cysts or follicles of the left ovary. Other: No abdominal wall hernia or abnormality. No abdominopelvic ascites. Musculoskeletal: No acute or significant osseous findings. IMPRESSION: 1. Small bowel malrotation is again noted with nonobstructed, nondistended small bowel loops in the right hemiabdomen and pelvis. 2. Prominence of submucosal fat along right colon from cecum to transverse colon, the right colon centered in the midline of the abdomen and pelvis as before. The prominent submucosal fat may represent changes of chronic inflammatory bowel disease or obesity accounting for the "Halo sign" of fat within large bowel wall. A moderate amount of fecal residue is seen within the transverse and descending colon with mild transmural thickening, findings of which may reflect changes of stercoral colitis. 3. Tiny 2 mm gallbladder polyp or gallstone seen within the gallbladder without secondary signs of cholecystitis. 4. Stable 5 mm exophytic interpolar renal hypodensity consistent with a cyst. 5. Small ovarian follicles or cysts of the left ovary. Hysterectomy. Electronically Signed   By: Tollie Eth M.D.   On: 03/01/2017 20:01    Micro Results    Recent Results (from the past 240 hour(s))  C difficile quick scan w PCR reflex     Status: None   Collection Time: 03/02/17  1:03 PM  Result Value Ref Range Status   C Diff antigen NEGATIVE NEGATIVE Final   C Diff toxin NEGATIVE NEGATIVE Final   C Diff interpretation No C. difficile detected.  Final       Today   Subjective:   Destiny Simmons today has no abdominal pain and diarrhea.tolerating food  well,  Objective:   Blood pressure 121/81, pulse 84, temperature 97.7 F (36.5 C), temperature source Oral, resp. rate 16, height 5' 2.01" (1.575 m), weight 86.9 kg (191 lb 8 oz), SpO2 97 %.  No intake or output data in the 24 hours ending 03/04/17 1439  Exam Awake Alert, Oriented x 3, No new F.N deficits, Normal affect Bacon.AT,PERRAL Supple Neck,No JVD, No cervical lymphadenopathy appriciated.  Symmetrical Chest wall movement, Good air movement bilaterally, CTAB RRR,No Gallops,Rubs or new Murmurs, No Parasternal Heave +ve B.Sounds, Abd Soft, Non tender, No organomegaly appriciated, No rebound -guarding or rigidity. No Cyanosis, Clubbing or edema, No new Rash or bruise  Data Review   CBC w Diff:  Lab Results  Component Value Date   WBC 8.7 03/03/2017   HGB 13.3 03/03/2017   HGB 13.7 09/22/2014   HCT 40.6 03/03/2017   HCT 42.6 09/22/2014   PLT 191 03/03/2017   PLT 216 09/22/2014   LYMPHOPCT 10 03/01/2017   LYMPHOPCT 24.8 07/16/2012   MONOPCT 4  03/01/2017   MONOPCT 4.5 07/16/2012   EOSPCT 0 03/01/2017   EOSPCT 1.5 07/16/2012   BASOPCT 0 03/01/2017   BASOPCT 0.5 07/16/2012    CMP:  Lab Results  Component Value Date   NA 137 03/03/2017   NA 136 09/22/2014   K 3.6 03/03/2017   K 3.4 (L) 09/22/2014   CL 109 03/03/2017   CL 103 09/22/2014   CO2 23 03/03/2017   CO2 28 09/22/2014   BUN 10 03/03/2017   BUN 13 09/22/2014   CREATININE 0.66 03/03/2017   CREATININE 0.85 09/22/2014   PROT 6.6 03/02/2017   PROT 7.7 09/22/2014   ALBUMIN 3.6 03/02/2017   ALBUMIN 3.7 09/22/2014   BILITOT 0.8 03/02/2017   BILITOT 0.5 09/22/2014   ALKPHOS 90 03/02/2017   ALKPHOS 128 (H) 09/22/2014   AST 13 (L) 03/02/2017   AST 17 09/22/2014   ALT 10 (L) 03/02/2017   ALT 16 09/22/2014  .   Total Time in preparing paper work, data evaluation and todays exam - 35 minutes  Destiny Simmons M.D on 03/03/2017 at 2:39 PM    Note: This dictation was prepared with Dragon dictation along  with smaller phrase technology. Any transcriptional errors that result from this process are unintentional.

## 2017-09-27 ENCOUNTER — Ambulatory Visit (INDEPENDENT_AMBULATORY_CARE_PROVIDER_SITE_OTHER): Payer: 59 | Admitting: Licensed Clinical Social Worker

## 2017-09-27 DIAGNOSIS — F322 Major depressive disorder, single episode, severe without psychotic features: Secondary | ICD-10-CM | POA: Diagnosis not present
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
## 2017-10-11 ENCOUNTER — Encounter: Payer: Self-pay | Admitting: Psychiatry

## 2017-10-11 ENCOUNTER — Ambulatory Visit (INDEPENDENT_AMBULATORY_CARE_PROVIDER_SITE_OTHER): Payer: Medicare Other | Admitting: Licensed Clinical Social Worker

## 2017-10-11 ENCOUNTER — Ambulatory Visit (INDEPENDENT_AMBULATORY_CARE_PROVIDER_SITE_OTHER): Payer: Medicare Other | Admitting: Psychiatry

## 2017-10-11 ENCOUNTER — Ambulatory Visit: Payer: Medicare Other | Admitting: Licensed Clinical Social Worker

## 2017-10-11 VITALS — BP 145/96 | HR 83 | Temp 98.6°F | Wt 184.8 lb

## 2017-10-11 DIAGNOSIS — F322 Major depressive disorder, single episode, severe without psychotic features: Secondary | ICD-10-CM | POA: Diagnosis not present

## 2017-10-11 DIAGNOSIS — F1910 Other psychoactive substance abuse, uncomplicated: Secondary | ICD-10-CM | POA: Insufficient documentation

## 2017-10-11 DIAGNOSIS — K920 Hematemesis: Secondary | ICD-10-CM | POA: Insufficient documentation

## 2017-10-11 DIAGNOSIS — F32A Depression, unspecified: Secondary | ICD-10-CM | POA: Insufficient documentation

## 2017-10-11 DIAGNOSIS — F411 Generalized anxiety disorder: Secondary | ICD-10-CM

## 2017-10-11 DIAGNOSIS — F17200 Nicotine dependence, unspecified, uncomplicated: Secondary | ICD-10-CM | POA: Insufficient documentation

## 2017-10-11 DIAGNOSIS — E039 Hypothyroidism, unspecified: Secondary | ICD-10-CM | POA: Insufficient documentation

## 2017-10-11 DIAGNOSIS — G43909 Migraine, unspecified, not intractable, without status migrainosus: Secondary | ICD-10-CM | POA: Insufficient documentation

## 2017-10-11 DIAGNOSIS — F603 Borderline personality disorder: Secondary | ICD-10-CM | POA: Insufficient documentation

## 2017-10-11 DIAGNOSIS — F419 Anxiety disorder, unspecified: Secondary | ICD-10-CM | POA: Insufficient documentation

## 2017-10-11 DIAGNOSIS — G4733 Obstructive sleep apnea (adult) (pediatric): Secondary | ICD-10-CM | POA: Insufficient documentation

## 2017-10-11 DIAGNOSIS — R112 Nausea with vomiting, unspecified: Secondary | ICD-10-CM | POA: Insufficient documentation

## 2017-10-11 DIAGNOSIS — G4700 Insomnia, unspecified: Secondary | ICD-10-CM

## 2017-10-11 DIAGNOSIS — M1990 Unspecified osteoarthritis, unspecified site: Secondary | ICD-10-CM | POA: Insufficient documentation

## 2017-10-11 DIAGNOSIS — F259 Schizoaffective disorder, unspecified: Secondary | ICD-10-CM | POA: Insufficient documentation

## 2017-10-11 DIAGNOSIS — G4730 Sleep apnea, unspecified: Secondary | ICD-10-CM | POA: Insufficient documentation

## 2017-10-11 DIAGNOSIS — F3162 Bipolar disorder, current episode mixed, moderate: Secondary | ICD-10-CM

## 2017-10-11 DIAGNOSIS — K921 Melena: Secondary | ICD-10-CM | POA: Insufficient documentation

## 2017-10-11 DIAGNOSIS — M5136 Other intervertebral disc degeneration, lumbar region: Secondary | ICD-10-CM | POA: Insufficient documentation

## 2017-10-11 DIAGNOSIS — F449 Dissociative and conversion disorder, unspecified: Secondary | ICD-10-CM | POA: Insufficient documentation

## 2017-10-11 DIAGNOSIS — J45909 Unspecified asthma, uncomplicated: Secondary | ICD-10-CM | POA: Insufficient documentation

## 2017-10-11 DIAGNOSIS — F1220 Cannabis dependence, uncomplicated: Secondary | ICD-10-CM

## 2017-10-11 DIAGNOSIS — F329 Major depressive disorder, single episode, unspecified: Secondary | ICD-10-CM | POA: Insufficient documentation

## 2017-10-11 DIAGNOSIS — F410 Panic disorder [episodic paroxysmal anxiety] without agoraphobia: Secondary | ICD-10-CM | POA: Diagnosis not present

## 2017-10-11 DIAGNOSIS — M9979 Connective tissue and disc stenosis of intervertebral foramina of abdomen and other regions: Secondary | ICD-10-CM | POA: Insufficient documentation

## 2017-10-11 DIAGNOSIS — E079 Disorder of thyroid, unspecified: Secondary | ICD-10-CM

## 2017-10-11 HISTORY — DX: Disorder of thyroid, unspecified: E07.9

## 2017-10-11 MED ORDER — DULOXETINE HCL 20 MG PO CPEP: 20.0000 mg | ORAL_CAPSULE | Freq: Two times a day (BID) | ORAL | 1 refills | Status: DC

## 2017-10-11 MED ORDER — TRAZODONE HCL 50 MG PO TABS: 150.0000 mg | ORAL_TABLET | Freq: Every day | ORAL | 1 refills | Status: DC

## 2017-10-11 MED ORDER — LURASIDONE HCL 40 MG PO TABS: 40.0000 mg | ORAL_TABLET | Freq: Every day | ORAL | 1 refills | Status: DC

## 2017-10-11 MED ORDER — HYDROXYZINE PAMOATE 25 MG PO CAPS: 25.0000 mg | ORAL_CAPSULE | Freq: Three times a day (TID) | ORAL | 1 refills | Status: DC | PRN

## 2017-10-11 NOTE — Progress Notes (Signed)
   THERAPIST PROGRESS NOTE  Session Time:  Participation Level: Active  Behavioral Response: CasualAlertDepressed  Type of Therapy: Individual Therapy  Treatment Goals addressed: Coping  Interventions: CBT, Motivational Interviewing and Supportive  Summary: Destiny Simmons is a 46 y.o. female who presents with continued symptoms of her diagnosis. Patient reports that she is stressed.  SHe reports that her stressors are her husband, mother in law and her inability to see her children/grandchildren daily.  She vented about her current relationship and how she feels that he does not care about her.  She was able to provide an account of her mental health concerns.  She reports that she has a history of suicidal ideations.  Reports that she needs marijuana to assist in relaxation techniques.   LCSW discussed what psychotherapy is and is not and the importance of the therapeutic relationship to include open and honest communication between client and therapist and building trust.  Reviewed advantages and disadvantages of the therapeutic process and limitations to the therapeutic relationship including LCSW's role in maintaining the safety of the client, others and those in client's care.   Suicidal/Homicidal: No  Therapist Response: Assessed pt current functioning per pt report.  Focused on rapport building w/ pt and exploring w/ pt her tx hx and what she is looking for in counseling currently.  Discussed current symptoms and focused on pt utilizing her strengths w/ typical struggles w/ winter months ahead.  Developed tx plan w/ pt.  Plan: Return again in 2 weeks.  Diagnosis: Axis I: Major Depression, Recurrent severe    Axis II: No diagnosis    Marinda Elk, LCSW 10/11/2017
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
## 2017-10-11 NOTE — Patient Instructions (Signed)
Trazodone extended release oral tablets What is this medicine? TRAZODONE (TRAZ oh done) is used to treat depression. This medicine may be used for other purposes; ask your health care provider or pharmacist if you have questions. COMMON BRAND NAME(S): Oleptro What should I tell my health care provider before I take this medicine? They need to know if you have any of these conditions: -attempted suicide or thinking about it -bipolar disorder -bleeding problems -glaucoma -heart disease, or previous heart attack -irregular heart beat -kidney disease -liver disease -low levels of sodium in the blood -an unusual or allergic reaction to trazodone, other medicines, foods, dyes or preservatives -pregnant or trying to get pregnant -breast-feeding How should I use this medicine? Take this medicine by mouth with a glass of water. Follow the directions on the prescription label. Take this medicine on an empty stomach, at least 30 minutes before or 2 hours after food. Do not take with food. Do not crush or chew this medicine. You may break in half along the score line. Take your medicine at bedtime everyday. Do not take your medicine more often than directed. Do not stop taking this medicine suddenly except upon the advice of your doctor. Stopping this medicine too quickly may cause serious side effects or your condition may worsen. A special MedGuide will be given to you by the pharmacist with each prescription and refill. Be sure to read this information carefully each time. Talk to your pediatrician regarding the use of this medicine in children. Special care may be needed. Overdosage: If you think you have taken too much of this medicine contact a poison control center or emergency room at once. NOTE: This medicine is only for you. Do not share this medicine with others. What if I miss a dose? If you miss a dose, take it as soon as you can. If it is almost time for your next dose, take only that  dose. Do not take double or extra doses. What may interact with this medicine? Do not take this medicine with any of the following medications: -certain medicines for fungal infections like fluconazole, itraconazole, ketoconazole, posaconazole, voriconazole -cisapride -dofetilide -dronedarone -linezolid -MAOIs like Carbex, Eldepryl, Marplan, Nardil, and Parnate -mesoridazine -methylene blue (injected into a vein) -pimozide -saquinavir -thioridazine -ziprasidone This medicine may also interact with the following medications: -alcohol -antiviral medicines for HIV or AIDS -aspirin and aspirin-like medicines -barbiturates like phenobarbital -certain medicines for blood pressure, heart disease, irregular heart beat -certain medicines for depression, anxiety, or psychotic disturbances -certain medicines for migraine headache like almotriptan, eletriptan, frovatriptan, naratriptan, rizatriptan, sumatriptan, zolmitriptan -certain medicines for seizures like carbamazepine, phenytoin -certain medicines for sleep -certain medicines that treat or prevent blood clots like dalteparin, enoxaparin, warfarin -digoxin -fentanyl -lithium -NSAIDS, medicines for pain and inflammation, like ibuprofen or naproxen -other medicines that prolong the QT interval (cause an abnormal heart rhythm) -rasagiline -supplements like St. John's wort, kava kava, valerian -tramadol -tryptophan This list may not describe all possible interactions. Give your health care provider a list of all the medicines, herbs, non-prescription drugs, or dietary supplements you use. Also tell them if you smoke, drink alcohol, or use illegal drugs. Some items may interact with your medicine. What should I watch for while using this medicine? Tell your doctor if your symptoms do not get better or if they get worse. Visit your doctor or health care professional for regular checks on your progress. Because it may take several weeks to  see the full effects of this medicine,  it is important to continue your treatment as prescribed by your doctor. Patients and their families should watch out for new or worsening thoughts of suicide or depression. Also watch out for sudden changes in feelings such as feeling anxious, agitated, panicky, irritable, hostile, aggressive, impulsive, severely restless, overly excited and hyperactive, or not being able to sleep. If this happens, especially at the beginning of treatment or after a change in dose, call your health care professional. Bonita Quin may get drowsy or dizzy. Do not drive, use machinery, or do anything that needs mental alertness until you know how this medicine affects you. Do not stand or sit up quickly, especially if you are an older patient. This reduces the risk of dizzy or fainting spells. Alcohol may interfere with the effect of this medicine. Avoid alcoholic drinks. This medicine may cause dry eyes and blurred vision. If you wear contact lenses you may feel some discomfort. Lubricating drops may help. See your eye doctor if the problem does not go away or is severe. Your mouth may get dry. Chewing sugarless gum, sucking hard candy and drinking plenty of water may help. Contact your doctor if the problem does not go away or is severe. What side effects may I notice from receiving this medicine? Side effects that you should report to your doctor or health care professional as soon as possible: -allergic reactions like skin rash, itching or hives, swelling of the face, lips, or tongue -elevated mood, decreased need for sleep, racing thoughts, impulsive behavior -confusion -fast, irregular heartbeat -feeling faint or lightheaded, falls -feeling agitated, angry, or irritable -loss of balance or coordination -painful or prolonged erections -restlessness, pacing, inability to keep still -suicidal thoughts or other mood changes -tremors -trouble sleeping -seizures -unusual bleeding or  bruising Side effects that usually do not require medical attention (report to your doctor or health care professional if they continue or are bothersome): -change in sex drive or performance -change in appetite or weight -constipation -headache -muscle aches or pains -nausea This list may not describe all possible side effects. Call your doctor for medical advice about side effects. You may report side effects to FDA at 1-800-FDA-1088. Where should I keep my medicine? Keep out of the reach of children. Store at room temperature between 15 and 30 degrees C (59 to 86 degrees F). Protect from light. Keep container tightly closed. Throw away any unused medicine after the expiration date. NOTE: This sheet is a summary. It may not cover all possible information. If you have questions about this medicine, talk to your doctor, pharmacist, or health care provider.  2018 [employer] Standard (2016-05-13 16:55:11) Hydroxyzine capsules or tablets What is this medicine? HYDROXYZINE (hye DROX i zeen) is an antihistamine. This medicine is used to treat allergy symptoms. It is also used to treat anxiety and tension. This medicine can be used with other medicines to induce sleep before surgery. This medicine may be used for other purposes; ask your health care provider or pharmacist if you have questions. COMMON BRAND NAME(S): ANX, Atarax, Rezine, Vistaril What should I tell my health care provider before I take this medicine? They need to know if you have any of these conditions: -any chronic illness -difficulty passing urine -glaucoma -heart disease -kidney disease -liver disease -lung disease -an unusual or allergic reaction to hydroxyzine, cetirizine, other medicines, foods, dyes, or preservatives -pregnant or trying to get pregnant -breast-feeding How should I use this medicine? Take this medicine by mouth with a full glass of water. Follow  the directions on the prescription label. You may  take this medicine with food or on an empty stomach. Take your medicine at regular intervals. Do not take your medicine more often than directed. Talk to your pediatrician regarding the use of this medicine in children. Special care may be needed. While this drug may be prescribed for children as young as 53 years of age for selected conditions, precautions do apply. Patients over 28 years old may have a stronger reaction and need a smaller dose. Overdosage: If you think you have taken too much of this medicine contact a poison control center or emergency room at once. NOTE: This medicine is only for you. Do not share this medicine with others. What if I miss a dose? If you miss a dose, take it as soon as you can. If it is almost time for your next dose, take only that dose. Do not take double or extra doses. What may interact with this medicine? -alcohol -barbiturate medicines for sleep or seizures -medicines for colds, allergies -medicines for depression, anxiety, or emotional disturbances -medicines for pain -medicines for sleep -muscle relaxants This list may not describe all possible interactions. Give your health care provider a list of all the medicines, herbs, non-prescription drugs, or dietary supplements you use. Also tell them if you smoke, drink alcohol, or use illegal drugs. Some items may interact with your medicine. What should I watch for while using this medicine? Tell your doctor or health care professional if your symptoms do not improve. You may get drowsy or dizzy. Do not drive, use machinery, or do anything that needs mental alertness until you know how this medicine affects you. Do not stand or sit up quickly, especially if you are an older patient. This reduces the risk of dizzy or fainting spells. Alcohol may interfere with the effect of this medicine. Avoid alcoholic drinks. Your mouth may get dry. Chewing sugarless gum or sucking hard candy, and drinking plenty of water  may help. Contact your doctor if the problem does not go away or is severe. This medicine may cause dry eyes and blurred vision. If you wear contact lenses you may feel some discomfort. Lubricating drops may help. See your eye doctor if the problem does not go away or is severe. If you are receiving skin tests for allergies, tell your doctor you are using this medicine. What side effects may I notice from receiving this medicine? Side effects that you should report to your doctor or health care professional as soon as possible: -fast or irregular heartbeat -difficulty passing urine -seizures -slurred speech or confusion -tremor Side effects that usually do not require medical attention (report to your doctor or health care professional if they continue or are bothersome): -constipation -drowsiness -fatigue -headache -stomach upset This list may not describe all possible side effects. Call your doctor for medical advice about side effects. You may report side effects to FDA at 1-800-FDA-1088. Where should I keep my medicine? Keep out of the reach of children. Store at room temperature between 15 and 30 degrees C (59 and 86 degrees F). Keep container tightly closed. Throw away any unused medicine after the expiration date. NOTE: This sheet is a summary. It may not cover all possible information. If you have questions about this medicine, talk to your doctor, pharmacist, or health care provider.  2018 [employer] Standard (2008-04-26 14:50:59) Lurasidone oral tablet What is this medicine? LURASIDONE (loo RAS i done) is an antipsychotic. It is used to treat  schizophrenia or bipolar disorder, also known as manic-depression. This medicine may be used for other purposes; ask your health care provider or pharmacist if you have questions. COMMON BRAND NAME(S): Latuda What should I tell my health care provider before I take this medicine? They need to know if you have any of these  conditions: -dementia -diabetes -difficulty swallowing -heart disease -history of breast cancer -kidney disease -liver disease -low blood counts, like low white cell, platelet, or red cell counts -low blood pressure -Parkinson's disease -seizures -suicidal thoughts, plans, or attempt; a previous suicide attempt by you or a family member -an unusual or allergic reaction to lurasidone, other medicines, foods, dyes, or preservatives -pregnant or trying to get pregnant -breast-feeding How should I use this medicine? Take this medicine by mouth with a glass of water. Follow the directions on the prescription label. Take this medicine with food. Take your medicine at regular intervals. Do not take it more often than directed. Do not stop taking except on your doctor's advice. Talk to your pediatrician regarding the use of this medicine in children. While this drug may be prescribed for children as young as 13 years for selected conditions, precautions do apply. Overdosage: If you think you have taken too much of this medicine contact a poison control center or emergency room at once. NOTE: This medicine is only for you. Do not share this medicine with others. What if I miss a dose? If you miss a dose, take it as soon as you can. If it is almost time for your next dose, take only that dose. Do not take double or extra doses. What may interact with this medicine? Do not take this medicine with any of the following medications: -dalfopristin; quinupristin -delavirdine -indinavir -isoniazid -itraconazole -ketoconazole -lumacaftor; ivacaftor -metoclopramide -rifampin -ritonavir -tipranavir This medicine may also interact with the following medications: -alcohol -certain medicines for anxiety or sleep -diltiazem -digoxin -lithium -medicines for blood pressure This list may not describe all possible interactions. Give your health care provider a list of all the medicines, herbs,  non-prescription drugs, or dietary supplements you use. Also tell them if you smoke, drink alcohol, or use illegal drugs. Some items may interact with your medicine. What should I watch for while using this medicine? Visit your doctor or health care professional for regular checks on your progress. It may be several weeks before you see the full effects of this medicine. Notify your doctor or health care professional if your symptoms get worse, if you have new symptoms, if you are having an unusual effect from this medicine, or if you feel out of control, very discouraged or think you might harm yourself or others. Do not suddenly stop taking this medicine. You may need to gradually reduce the dose. Ask your doctor or health care professional for advice. You may get dizzy or drowsy. Do not drive, use machinery, or do anything that needs mental alertness until you know how this medicine affects you. Do not stand or sit up quickly, especially if you are an older patient. This reduces the risk of dizzy or fainting spells. Alcohol can increase dizziness and drowsiness. Avoid alcoholic drinks. This medicine may cause dry eyes and blurred vision. If you wear contact lenses you may feel some discomfort. Lubricating drops may help. See your eye doctor if the problem does not go away or is severe. This medicine can reduce the response of your body to heat or cold. Dress warm in cold weather and stay hydrated in  hot weather. If possible, avoid extreme temperatures like saunas, hot tubs, very hot or cold showers, or activities that can cause dehydration such as vigorous exercise. What side effects may I notice from receiving this medicine? Side effects that you should report to your doctor or health care professional as soon as possible: -allergic reactions like skin rash, itching or hives, swelling of the face, lips, or tongue -confusion -feeling faint or lightheaded, falls -fever or chills, sore throat -high  fever -inability to control muscle movements in the face, mouth, hands, arms, or legs -increased hunger or thirst -increased urination -missed menstrual periods -restlessness or need to keep moving -seizures -stiffness, spasms, trembling -suicidal thoughts or other mood changes -unusually weak or tired Side effects that usually do not require medical attention (report to your doctor or health care professional if they continue or are bothersome): -blurred vision -change in sex drive or performance -diarrhea -drowsiness -nausea, vomiting -weight gain This list may not describe all possible side effects. Call your doctor for medical advice about side effects. You may report side effects to FDA at 1-800-FDA-1088. Where should I keep my medicine? Keep out of the reach of children. Store at room temperature between 15 and 30 degrees C (59 and 86 degrees F). Throw away any unused medicine after the expiration date. NOTE: This sheet is a summary. It may not cover all possible information. If you have questions about this medicine, talk to your doctor, pharmacist, or health care provider.  2018 [employer] Standard (2016-01-28 10:23:22) Duloxetine delayed-release capsules What is this medicine? DULOXETINE (doo LOX e teen) is used to treat depression, anxiety, and different types of chronic pain. This medicine may be used for other purposes; ask your health care provider or pharmacist if you have questions. COMMON BRAND NAME(S): Cymbalta, Irenka What should I tell my health care provider before I take this medicine? They need to know if you have any of these conditions: -bipolar disorder or a family history of bipolar disorder -glaucoma -kidney disease -liver disease -suicidal thoughts or a previous suicide attempt -taken medicines called MAOIs like Carbex, Eldepryl, Marplan, Nardil, and Parnate within 14 days -an unusual reaction to duloxetine, other medicines, foods, dyes, or  preservatives -pregnant or trying to get pregnant -breast-feeding How should I use this medicine? Take this medicine by mouth with a glass of water. Follow the directions on the prescription label. Do not cut, crush or chew this medicine. You can take this medicine with or without food. Take your medicine at regular intervals. Do not take your medicine more often than directed. Do not stop taking this medicine suddenly except upon the advice of your doctor. Stopping this medicine too quickly may cause serious side effects or your condition may worsen. A special MedGuide will be given to you by the pharmacist with each prescription and refill. Be sure to read this information carefully each time. Talk to your pediatrician regarding the use of this medicine in children. While this drug may be prescribed for children as young as 49 years of age for selected conditions, precautions do apply. Overdosage: If you think you have taken too much of this medicine contact a poison control center or emergency room at once. NOTE: This medicine is only for you. Do not share this medicine with others. What if I miss a dose? If you miss a dose, take it as soon as you can. If it is almost time for your next dose, take only that dose. Do not take double or  extra doses. What may interact with this medicine? Do not take this medicine with any of the following medications: -desvenlafaxine -levomilnacipran -linezolid -MAOIs like Carbex, Eldepryl, Marplan, Nardil, and Parnate -methylene blue (injected into a vein) -milnacipran -thioridazine -venlafaxine This medicine may also interact with the following medications: -alcohol -amphetamines -aspirin and aspirin-like medicines -certain antibiotics like ciprofloxacin and enoxacin -certain medicines for blood pressure, heart disease, irregular heart beat -certain medicines for depression, anxiety, or psychotic disturbances -certain medicines for migraine headache like  almotriptan, eletriptan, frovatriptan, naratriptan, rizatriptan, sumatriptan, zolmitriptan -certain medicines that treat or prevent blood clots like warfarin, enoxaparin, and dalteparin -cimetidine -fentanyl -lithium -NSAIDS, medicines for pain and inflammation, like ibuprofen or naproxen -phentermine -procarbazine -rasagiline -sibutramine -St. John's wort -theophylline -tramadol -tryptophan This list may not describe all possible interactions. Give your health care provider a list of all the medicines, herbs, non-prescription drugs, or dietary supplements you use. Also tell them if you smoke, drink alcohol, or use illegal drugs. Some items may interact with your medicine. What should I watch for while using this medicine? Tell your doctor if your symptoms do not get better or if they get worse. Visit your doctor or health care professional for regular checks on your progress. Because it may take several weeks to see the full effects of this medicine, it is important to continue your treatment as prescribed by your doctor. Patients and their families should watch out for new or worsening thoughts of suicide or depression. Also watch out for sudden changes in feelings such as feeling anxious, agitated, panicky, irritable, hostile, aggressive, impulsive, severely restless, overly excited and hyperactive, or not being able to sleep. If this happens, especially at the beginning of treatment or after a change in dose, call your health care professional. Bonita Quin may get drowsy or dizzy. Do not drive, use machinery, or do anything that needs mental alertness until you know how this medicine affects you. Do not stand or sit up quickly, especially if you are an older patient. This reduces the risk of dizzy or fainting spells. Alcohol may interfere with the effect of this medicine. Avoid alcoholic drinks. This medicine can cause an increase in blood pressure. This medicine can also cause a sudden drop in your  blood pressure, which may make you feel faint and increase the chance of a fall. These effects are most common when you first start the medicine or when the dose is increased, or during use of other medicines that can cause a sudden drop in blood pressure. Check with your doctor for instructions on monitoring your blood pressure while taking this medicine. Your mouth may get dry. Chewing sugarless gum or sucking hard candy, and drinking plenty of water may help. Contact your doctor if the problem does not go away or is severe. What side effects may I notice from receiving this medicine? Side effects that you should report to your doctor or health care professional as soon as possible: -allergic reactions like skin rash, itching or hives, swelling of the face, lips, or tongue -anxious -breathing problems -confusion -changes in vision -chest pain -confusion -elevated mood, decreased need for sleep, racing thoughts, impulsive behavior -eye pain -fast, irregular heartbeat -feeling faint or lightheaded, falls -feeling agitated, angry, or irritable -hallucination, loss of contact with reality -high blood pressure -loss of balance or coordination -palpitations -redness, blistering, peeling or loosening of the skin, including inside the mouth -restlessness, pacing, inability to keep still -seizures -stiff muscles -suicidal thoughts or other mood changes -trouble passing urine  or change in the amount of urine -trouble sleeping -unusual bleeding or bruising -unusually weak or tired -vomiting -yellowing of the eyes or skin Side effects that usually do not require medical attention (report to your doctor or health care professional if they continue or are bothersome): -change in sex drive or performance -change in appetite or weight -constipation -dizziness -dry mouth -headache -increased sweating -nausea -tired This list may not describe all possible side effects. Call your doctor for  medical advice about side effects. You may report side effects to FDA at 1-800-FDA-1088. Where should I keep my medicine? Keep out of the reach of children. Store at room temperature between 20 and 25 degrees C (68 to 77 degrees F). Throw away any unused medicine after the expiration date. NOTE: This sheet is a summary. It may not cover all possible information. If you have questions about this medicine, talk to your doctor, pharmacist, or health care provider.  2018 [employer] Standard (2016-05-13 18:16:03)

## 2017-10-28 ENCOUNTER — Ambulatory Visit: Payer: 59 | Admitting: Licensed Clinical Social Worker

## 2017-11-01 ENCOUNTER — Ambulatory Visit: Payer: 59 | Admitting: Psychiatry

## 2017-11-21 ENCOUNTER — Emergency Department: Payer: Medicare Other

## 2017-11-21 ENCOUNTER — Encounter: Payer: Self-pay | Admitting: Emergency Medicine

## 2017-11-21 ENCOUNTER — Emergency Department
Admission: EM | Admit: 2017-11-21 | Discharge: 2017-11-21 | Disposition: A | Discharge status: Home / Self Care | Payer: Medicare Other | Attending: Emergency Medicine | Admitting: Emergency Medicine

## 2017-11-21 ENCOUNTER — Other Ambulatory Visit: Payer: Self-pay | Admitting: Family Medicine

## 2017-11-21 DIAGNOSIS — R109 Unspecified abdominal pain: Secondary | ICD-10-CM | POA: Insufficient documentation

## 2017-11-21 DIAGNOSIS — J45909 Unspecified asthma, uncomplicated: Secondary | ICD-10-CM | POA: Insufficient documentation

## 2017-11-21 DIAGNOSIS — Z79899 Other long term (current) drug therapy: Secondary | ICD-10-CM | POA: Diagnosis not present

## 2017-11-21 DIAGNOSIS — T887XXA Unspecified adverse effect of drug or medicament, initial encounter: Secondary | ICD-10-CM | POA: Insufficient documentation

## 2017-11-21 DIAGNOSIS — Z1231 Encounter for screening mammogram for malignant neoplasm of breast: Secondary | ICD-10-CM

## 2017-11-21 DIAGNOSIS — F17210 Nicotine dependence, cigarettes, uncomplicated: Secondary | ICD-10-CM | POA: Diagnosis not present

## 2017-11-21 DIAGNOSIS — K529 Noninfective gastroenteritis and colitis, unspecified: Secondary | ICD-10-CM

## 2017-11-21 DIAGNOSIS — J449 Chronic obstructive pulmonary disease, unspecified: Secondary | ICD-10-CM | POA: Diagnosis not present

## 2017-11-21 DIAGNOSIS — Y658 Other specified misadventures during surgical and medical care: Secondary | ICD-10-CM | POA: Diagnosis not present

## 2017-11-21 DIAGNOSIS — T424X1A Poisoning by benzodiazepines, accidental (unintentional), initial encounter: Secondary | ICD-10-CM | POA: Insufficient documentation

## 2017-11-21 DIAGNOSIS — F909 Attention-deficit hyperactivity disorder, unspecified type: Secondary | ICD-10-CM | POA: Diagnosis not present

## 2017-11-21 LAB — CBC WITH DIFFERENTIAL/PLATELET
BASOS ABS: 0.2 10*3/uL — AB (ref 0–0.1)
Basophils Relative: 2 %
Eosinophils Absolute: 0.5 10*3/uL (ref 0–0.7)
Eosinophils Relative: 5 %
HEMATOCRIT: 42.6 % (ref 35.0–47.0)
Hemoglobin: 14.3 g/dL (ref 12.0–16.0)
LYMPHS ABS: 2.6 10*3/uL (ref 1.0–3.6)
LYMPHS PCT: 28 %
MCH: 29.4 pg (ref 26.0–34.0)
MCHC: 33.6 g/dL (ref 32.0–36.0)
MCV: 87.5 fL (ref 80.0–100.0)
MONO ABS: 0.7 10*3/uL (ref 0.2–0.9)
MONOS PCT: 7 %
NEUTROS ABS: 5.3 10*3/uL (ref 1.4–6.5)
Neutrophils Relative %: 58 %
Platelets: 224 10*3/uL (ref 150–440)
RBC: 4.87 MIL/uL (ref 3.80–5.20)
RDW: 14.5 % (ref 11.5–14.5)
WBC: 9.3 10*3/uL (ref 3.6–11.0)

## 2017-11-21 LAB — BASIC METABOLIC PANEL
Anion gap: 10 (ref 5–15)
BUN: 19 mg/dL (ref 6–20)
CO2: 23 mmol/L (ref 22–32)
Calcium: 8.9 mg/dL (ref 8.9–10.3)
Chloride: 102 mmol/L (ref 101–111)
Creatinine, Ser: 0.81 mg/dL (ref 0.44–1.00)
GFR calc Af Amer: >60 mL/min (ref >60)
GFR calc non Af Amer: >60 mL/min (ref >60)
Glucose, Bld: 92 mg/dL (ref 65–99)
Potassium: 3.5 mmol/L (ref 3.5–5.1)
SODIUM: 135 mmol/L (ref 135–145)

## 2017-11-21 LAB — HEPATIC FUNCTION PANEL
ALBUMIN: 4 g/dL (ref 3.5–5.0)
ALK PHOS: 111 U/L (ref 38–126)
ALT: 15 U/L (ref 14–54)
AST: 16 U/L (ref 15–41)
BILIRUBIN TOTAL: 1.3 mg/dL — AB (ref 0.3–1.2)
Bilirubin, Direct: 0.2 mg/dL (ref 0.1–0.5)
Indirect Bilirubin: 1.1 mg/dL — ABNORMAL HIGH (ref 0.3–0.9)
Total Protein: 7.5 g/dL (ref 6.5–8.1)

## 2017-11-21 LAB — ETHANOL: Alcohol, Ethyl (B): <10 mg/dL (ref <10)

## 2017-11-21 LAB — LIPASE, BLOOD: LIPASE: 45 U/L (ref 11–51)

## 2017-11-21 LAB — POCT PREGNANCY, URINE: Preg Test, Ur: NEGATIVE

## 2017-11-21 MED ORDER — IOPAMIDOL (ISOVUE-300) INJECTION 61%
100.0000 mL | Freq: Once | INTRAVENOUS | Status: AC | PRN
  Administered 2017-11-21: 100 mL via INTRAVENOUS
  Filled 2017-11-21: qty 100

## 2017-11-21 MED ORDER — SULFAMETHOXAZOLE-TRIMETHOPRIM 800-160 MG PO TABS: 1.0000 | ORAL_TABLET | Freq: Two times a day (BID) | ORAL | 0 refills | Status: DC

## 2017-11-21 MED ORDER — KETOROLAC TROMETHAMINE 30 MG/ML IJ SOLN
15.0000 mg | Freq: Once | INTRAMUSCULAR | Status: AC
  Administered 2017-11-21: 15 mg via INTRAVENOUS
  Filled 2017-11-21: qty 1

## 2017-11-21 NOTE — Discharge Instructions (Signed)
Please take all of your antibiotics as prescribed and follow up with your PMD in two days for a recheck.  Return to the ED sooner for any concerns.  STOP TAKING MEDICATIONS THAT WERE NOT PRESCRIBED TO YOU, IT'S EXTREMELY DANGEROUS  It was a pleasure to take care of you today, and thank you for coming to our emergency department.  If you have any questions or concerns before leaving please ask the nurse to grab me and I'm more than happy to go through your aftercare instructions again.  If you were prescribed any opioid pain medication today such as Norco, Vicodin, Percocet, morphine, hydrocodone, or oxycodone please make sure you do not drive when you are taking this medication as it can alter your ability to drive safely.  If you have any concerns once you are home that you are not improving or are in fact getting worse before you can make it to your follow-up appointment, please do not hesitate to call 911 and come back for further evaluation.  Merrily BrittleNeil Saul Dorsi, MD  Results for orders placed or performed during the hospital encounter of 11/21/17  Basic metabolic panel  Result Value Ref Range   Sodium 135 135 - 145 mmol/L   Potassium 3.5 3.5 - 5.1 mmol/L   Chloride 102 101 - 111 mmol/L   CO2 23 22 - 32 mmol/L   Glucose, Bld 92 65 - 99 mg/dL   BUN 19 6 - 20 mg/dL   Creatinine, Ser 1.610.81 0.44 - 1.00 mg/dL   Calcium 8.9 8.9 - 09.610.3 mg/dL   GFR calc non Af Amer >60 >60 mL/min   GFR calc Af Amer >60 >60 mL/min   Anion gap 10 5 - 15  Hepatic function panel  Result Value Ref Range   Total Protein 7.5 6.5 - 8.1 g/dL   Albumin 4.0 3.5 - 5.0 g/dL   AST 16 15 - 41 U/L   ALT 15 14 - 54 U/L   Alkaline Phosphatase 111 38 - 126 U/L   Total Bilirubin 1.3 (H) 0.3 - 1.2 mg/dL   Bilirubin, Direct 0.2 0.1 - 0.5 mg/dL   Indirect Bilirubin 1.1 (H) 0.3 - 0.9 mg/dL  Ethanol  Result Value Ref Range   Alcohol, Ethyl (B) <10 <10 mg/dL  Lipase, blood  Result Value Ref Range   Lipase 45 11 - 51 U/L  CBC  with Differential  Result Value Ref Range   WBC 9.3 3.6 - 11.0 K/uL   RBC 4.87 3.80 - 5.20 MIL/uL   Hemoglobin 14.3 12.0 - 16.0 g/dL   HCT 04.542.6 40.935.0 - 81.147.0 %   MCV 87.5 80.0 - 100.0 fL   MCH 29.4 26.0 - 34.0 pg   MCHC 33.6 32.0 - 36.0 g/dL   RDW 91.414.5 78.211.5 - 95.614.5 %   Platelets 224 150 - 440 K/uL   Neutrophils Relative % 58 %   Neutro Abs 5.3 1.4 - 6.5 K/uL   Lymphocytes Relative 28 %   Lymphs Abs 2.6 1.0 - 3.6 K/uL   Monocytes Relative 7 %   Monocytes Absolute 0.7 0.2 - 0.9 K/uL   Eosinophils Relative 5 %   Eosinophils Absolute 0.5 0 - 0.7 K/uL   Basophils Relative 2 %   Basophils Absolute 0.2 (H) 0 - 0.1 K/uL  Pregnancy, urine POC  Result Value Ref Range   Preg Test, Ur NEGATIVE NEGATIVE   Ct Abdomen Pelvis W Contrast  Result Date: 11/21/2017 CLINICAL DATA:  Abdominal pain for 5 years EXAM:  CT ABDOMEN AND PELVIS WITH CONTRAST TECHNIQUE: Multidetector CT imaging of the abdomen and pelvis was performed using the standard protocol following bolus administration of intravenous contrast. CONTRAST:  100mL ISOVUE-300 IOPAMIDOL (ISOVUE-300) INJECTION 61% COMPARISON:  03/01/2017, 06/09/2015 FINDINGS: Lower chest: No acute abnormality. Hepatobiliary: No focal liver abnormality is seen. No gallstones, gallbladder wall thickening, or biliary dilatation. Pancreas: Unremarkable. No pancreatic ductal dilatation or surrounding inflammatory changes. Spleen: Normal in size without focal abnormality. Adrenals/Urinary Tract: Adrenal glands are unremarkable. Kidneys are normal, without renal calculi, focal lesion, or hydronephrosis. Bladder is unremarkable. Stomach/Bowel: Stomach is nonenlarged. Malrotated appearance of the bowel with small bowel to the right in colon to the left. No evidence for an obstruction. Possible thickened segment of sigmoid colon in the pelvis. Vascular/Lymphatic: Aortic atherosclerosis. No enlarged abdominal or pelvic lymph nodes. Reproductive: Status post hysterectomy. 3.8 cm cyst  in the left ovary. Other: Negative for free air or free fluid. Musculoskeletal: No acute or significant osseous findings. IMPRESSION: 1. 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 No evidence for a bowel obstruction. 2. Heterogenous appearing segment of sigmoid colon with possible wall thickening, query colitis or intraluminal mass. Suggest correlation with direct visualization when clinically feasible. 3. There are otherwise no acute abnormalities visualized. Electronically Signed   By: Jasmine PangKim  Fujinaga M.D.   On: 11/21/2017 20:52

## 2017-11-21 NOTE — ED Provider Notes (Signed)
[facility] Emergency Department Provider Note  ____________________________________________   First MD Initiated Contact with Patient 11/21/17 1944     (approximate)  I have reviewed the triage vital signs and the nursing notes.   HISTORY  Chief Complaint Abdominal Pain  Level 5 exemption history limited by the patient's intoxication  HPI Destiny Simmons is a 46 y.o. female who self presents the emergency department with abdominal pain.  She has a long-standing history of chronic abdominal pain and says she has had multiple episodes of colitis and previous malrotation.  She said her's pain was acutely worse today so she purchased 2 tablets of alprazolam from a friend and took a total of 2 mg to help with her pain.  She is requesting Dilaudid for her pain because morphine is not adequate to treat it.  She denies diarrhea.  She does report nausea but no vomiting.  No fevers or chills.  Past Medical History:  Diagnosis Date  . ADHD (attention deficit hyperactivity disorder)   . Anxiety   . Arthritis   . Asthma   . Bipolar disorder (HCC)   . COPD (chronic obstructive pulmonary disease) (HCC)   . Depression   . History of borderline personality disorder   . Malrotation of intestine   . PTSD (post-traumatic stress disorder)   . Sleep apnea   . Thyroid disease 10/11/2017    Patient Active Problem List   Diagnosis Date Noted  . Anxiety 10/11/2017  . Arthritis 10/11/2017  . Asthma without status asthmaticus 10/11/2017  . Borderline personality disorder (HCC) 10/11/2017  . Clinical depression 10/11/2017  . Degenerative disc disease, lumbar 10/11/2017  . Narrowing of intervertebral disc space 10/11/2017  . 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  . Depression 10/11/2017  . Headache, migraine 10/11/2017  . Histrionic behavior 10/11/2017  . Thyroid disease 10/11/2017  . Obstructive apnea 10/11/2017  . Schizo-affective psychosis (HCC) 10/11/2017  . Compulsive  tobacco user syndrome 10/11/2017  . Colitis   . Colitis, chronic, ulcerative (HCC) 03/01/2017  . Epigastric pain 03/31/2016  . Anxiety and depression 03/18/2016  . GERD (gastroesophageal reflux disease) 03/18/2016  . Tobacco use 03/18/2016  . Somnolence 03/14/2016  . Periodic limb movements of sleep 03/02/2016  . Moderate episode of recurrent major depressive disorder (HCC) 01/16/2016  . 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 Generalized abdominal pain 12/18/2015  . Chronic constipation 12/18/2015  . Chronic bilateral low back pain with left-sided sciatica 12/16/2015  . H/O drug abuse 12/16/2015  . Smoking trying to quit 12/16/2015  . Pulmonary emphysema (HCC) 11/28/2015  . Intestinal malrotation 05/29/2015  . Non-intractable cyclical vomiting with nausea 05/29/2015  . IBS (irritable bowel syndrome) 05/29/2015  . Back pain 07/20/2013  . Insomnia 07/20/2013  . Hyperlipidemia 07/20/2013  . History of drug abuse in remission 07/20/2013  . History of cardiac arrhythmia 07/20/2013  . Thyroid nodule 07/20/2013  . Weight gain 07/20/2013    Past Surgical History:  Procedure Laterality Date  . ABDOMINAL HYSTERECTOMY    . ABDOMINAL SURGERY    . COLONOSCOPY W/ BIOPSIES  04/02/2015   2 sessile polyps sigmoid colon  . ESOPHAGOGASTRODUODENOSCOPY ENDOSCOPY  04/02/2015   localized mildly erythomatous mucosa w/o active bleeding found in duodenal buld    Prior to Admission medications   Medication Sig Start Date End Date Taking? Authorizing Provider  albuterol (PROAIR HFA) 108 (90 Base) MCG/ACT inhaler Inhale 2 puffs into the lungs every 6 (six) hours as needed. For wheezing. 12/16/15   [provider]  aspirin-acetaminophen-caffeine (EXCEDRIN MIGRAINE) [phone] MG tablet Take by mouth.    [provider]  DULoxetine (CYMBALTA) 20 MG capsule Take 1 capsule (20 mg total) by mouth 2 (two) times daily. 10/11/17   Jomarie Longs, MD  Fluticasone-Salmeterol (ADVAIR DISKUS)  500-50 MCG/DOSE AEPB Inhale into the lungs. 12/16/15   [provider]  gabapentin (NEURONTIN) 600 MG tablet Take 600 mg by mouth 3 (three) times daily. 02/20/16 02/19/17  [provider]  hydrOXYzine (VISTARIL) 25 MG capsule Take 1 capsule (25 mg total) by mouth 3 (three) times daily as needed for anxiety. 10/11/17   Jomarie Longs, MD  lurasidone (LATUDA) 40 MG TABS tablet Take 1 tablet (40 mg total) by mouth daily with breakfast. 10/11/17   Jomarie Longs, MD  meloxicam (MOBIC) 15 MG tablet TK 1 T PO D P 10/03/17   [provider]  ondansetron (ZOFRAN ODT) 4 MG disintegrating tablet Take 1 tablet (4 mg total) by mouth every 8 (eight) hours as needed for nausea or vomiting. 02/03/17   Rockne Menghini, MD  sulfamethoxazole-trimethoprim (BACTRIM DS,SEPTRA DS) 800-160 MG tablet Take 1 tablet by mouth 2 (two) times daily. 11/21/17   Merrily Brittle, MD  tiZANidine (ZANAFLEX) 4 MG tablet TK 1 T PO TID FOR MUSCLE SPASTICITY 10/03/17   [provider]  traZODone (DESYREL) 50 MG tablet Take 3 tablets (150 mg total) by mouth at bedtime. 10/11/17   Jomarie Longs, MD  zolpidem (AMBIEN) 5 MG tablet TK 1 T PO IMM B BED FOR SLP 10/03/17   [provider]    Allergies Fluocinolone; Keflex [cephalexin]; Latex; Penicillins; Ciprofloxacin; Dog epithelium; Dyclonine; Hydrocodone-acetaminophen; Lamotrigine; Meperidine; Propoxyphene; Vicodin [hydrocodone-acetaminophen]; Codeine; Pollen extract; and Tape  Family History  Problem Relation Age of Onset  . Diabetes Father   . Alcohol abuse Father   . Anxiety disorder Father   . Depression Father   . Cancer Paternal Aunt        unknown cancer  . Cancer Paternal Uncle        unknown cancer  . Heart disease Paternal Grandfather   . OCD Sister   . Anxiety disorder Sister   . Depression Sister   . Bipolar disorder Sister   . Diabetes Sister   . ADD / ADHD Daughter   . Bipolar disorder Daughter   . ADD / ADHD  Grandchild   . Anxiety disorder Son   . Depression Son     Social History Social History   Tobacco Use  . Smoking status: Current Every Day Smoker    Packs/day: 1.00    Types: Cigarettes  . Smokeless tobacco: Never Used  Substance Use Topics  . Alcohol use: No  . Drug use: Yes    Types: Marijuana, Benzodiazepines    Comment: last used 2 days ago     Review of Systems Level 5 exemption history limited by the patient's intoxication  ____________________________________________   PHYSICAL EXAM:  VITAL SIGNS: ED Triage Vitals [11/21/17 1857]  Enc Vitals Group     BP 106/72     Pulse Rate 96     Resp 16     Temp 98.1 F (36.7 C)     Temp Source Oral     SpO2 98 %     Weight 178 lb (80.7 kg)     Height 5\' 2"  (1.575 m)     Head Circumference      Peak Flow      Pain Score 8  Pain Loc      Pain Edu?      Excl. in GC?     Constitutional: Appears quite sleepy but arousable to verbal stimulus no acute distress Eyes: PERRL EOMI. mid range and brisk Head: Atraumatic. Nose: No congestion/rhinnorhea. Mouth/Throat: No trismus Neck: No stridor.   Cardiovascular: Normal rate, regular rhythm. Grossly normal heart sounds.  Good peripheral circulation. Respiratory: Decreased respiratory effort.  No retractions. Lungs CTAB and moving good air Gastrointestinal: Soft mild diffuse tenderness rebound or guarding or peritonitis Musculoskeletal: No lower extremity edema   Neurologic:  Normal speech and language. No gross focal neurologic deficits are appreciated. Skin:  Skin is warm, dry and intact. No rash noted. Psychiatric: Mood and affect are normal. Speech and behavior are normal.    ____________________________________________   DIFFERENTIAL includes but not limited to  Malrotation, small bowel obstruction, large bowel obstruction, colitis, diverticulitis ____________________________________________   LABS (all labs ordered are listed, but only abnormal results  are displayed)  Labs Reviewed  HEPATIC FUNCTION PANEL - Abnormal; Notable for the following components:      Result Value   Total Bilirubin 1.3 (*)    Indirect Bilirubin 1.1 (*)    All other components within normal limits  CBC WITH DIFFERENTIAL/PLATELET - Abnormal; Notable for the following components:   Basophils Absolute 0.2 (*)    All other components within normal limits  BASIC METABOLIC PANEL  ETHANOL  LIPASE, BLOOD  POCT PREGNANCY, URINE    Blood work reviewed by me with no acute disease __________________________________________  EKG   ____________________________________________  RADIOLOGY  CT abdomen pelvis reviewed by me consistent with colitis ____________________________________________   PROCEDURES  Procedure(s) performed: no  Procedures  Critical Care performed: no  Observation: no ____________________________________________   INITIAL IMPRESSION / ASSESSMENT AND PLAN / ED COURSE  Pertinent labs & imaging results that were available during my care of the patient were reviewed by me and considered in my medical decision making (see chart for details).  The patient arrives clearly intoxicated on benzodiazepines and I do not feel comfortable giving her opioid pain medication.  CT scan done and is concerning for acute colitis I think is reasonable to treat her with antibiotics.  She is anaphylactic to both cephalosporins and ciprofloxacin as well will progress onto the third line agent of Bactrim.  She is able to take it without difficulty and her husband is here to take her home.  I have given her a 2-day follow-up.  The patient verbalizes understanding and agreement the plan.      ____________________________________________   FINAL CLINICAL IMPRESSION(S) / ED DIAGNOSES  Final diagnoses:  Colitis  Benzodiazepine overdose, accidental or unintentional, initial encounter      NEW MEDICATIONS STARTED DURING THIS VISIT:  This SmartLink is  deprecated. Use AVSMEDLIST instead to display the medication list for a patient.   Note:  This document was prepared using Dragon voice recognition software and may include unintentional dictation errors.     Merrily Brittleifenbark, Sharifa Bucholz, MD 11/23/17 [phone]

## 2017-11-21 NOTE — ED Notes (Signed)

## 2017-11-21 NOTE — ED Triage Notes (Addendum)
Pt to ED via POV with c/o abd pain x5 years but worse today. Pt appears to be drowsy, when walking to triage room pt could not walk straight. Pt states she took " 2 1mg  xanex prior to ED arrival" due to pain. Speech clear, answering questions appropriately . PT states she was vomiting bile today. Pt in NAD. VS stable

## 2018-03-10 ENCOUNTER — Emergency Department
Admission: EM | Admit: 2018-03-10 | Discharge: 2018-03-10 | Disposition: A | Discharge status: Home / Self Care | Payer: 59 | Attending: Emergency Medicine | Admitting: Emergency Medicine

## 2018-03-10 ENCOUNTER — Encounter: Payer: Self-pay | Admitting: Emergency Medicine

## 2018-03-10 DIAGNOSIS — J449 Chronic obstructive pulmonary disease, unspecified: Secondary | ICD-10-CM | POA: Diagnosis not present

## 2018-03-10 DIAGNOSIS — Z79899 Other long term (current) drug therapy: Secondary | ICD-10-CM | POA: Diagnosis not present

## 2018-03-10 DIAGNOSIS — J45909 Unspecified asthma, uncomplicated: Secondary | ICD-10-CM | POA: Insufficient documentation

## 2018-03-10 DIAGNOSIS — F1120 Opioid dependence, uncomplicated: Secondary | ICD-10-CM | POA: Insufficient documentation

## 2018-03-10 DIAGNOSIS — Z91040 Latex allergy status: Secondary | ICD-10-CM | POA: Diagnosis not present

## 2018-03-10 DIAGNOSIS — F1310 Sedative, hypnotic or anxiolytic abuse, uncomplicated: Secondary | ICD-10-CM | POA: Diagnosis not present

## 2018-03-10 LAB — URINE DRUG SCREEN, QUALITATIVE (ARMC ONLY)
AMPHETAMINES, UR SCREEN: NOT DETECTED
BENZODIAZEPINE, UR SCRN: POSITIVE — AB
Barbiturates, Ur Screen: NOT DETECTED
COCAINE METABOLITE, UR ~~LOC~~: POSITIVE — AB
Cannabinoid 50 Ng, Ur ~~LOC~~: POSITIVE — AB
MDMA (ECSTASY) UR SCREEN: NOT DETECTED
METHADONE SCREEN, URINE: NOT DETECTED
Opiate, Ur Screen: POSITIVE — AB
Phencyclidine (PCP) Ur S: NOT DETECTED
Tricyclic, Ur Screen: NOT DETECTED

## 2018-03-10 LAB — COMPREHENSIVE METABOLIC PANEL
ALT: 11 U/L — ABNORMAL LOW (ref 14–54)
ANION GAP: 10 (ref 5–15)
AST: 16 U/L (ref 15–41)
Albumin: 4.5 g/dL (ref 3.5–5.0)
Alkaline Phosphatase: 102 U/L (ref 38–126)
BUN: 21 mg/dL — ABNORMAL HIGH (ref 6–20)
CHLORIDE: 101 mmol/L (ref 101–111)
CO2: 27 mmol/L (ref 22–32)
Calcium: 9.3 mg/dL (ref 8.9–10.3)
Creatinine, Ser: 0.95 mg/dL (ref 0.44–1.00)
Glucose, Bld: 101 mg/dL — ABNORMAL HIGH (ref 65–99)
POTASSIUM: 3.6 mmol/L (ref 3.5–5.1)
SODIUM: 138 mmol/L (ref 135–145)
Total Bilirubin: 1 mg/dL (ref 0.3–1.2)
Total Protein: 8.1 g/dL (ref 6.5–8.1)

## 2018-03-10 LAB — CBC
HCT: 46.5 % (ref 35.0–47.0)
HEMOGLOBIN: 15.2 g/dL (ref 12.0–16.0)
MCH: 29.4 pg (ref 26.0–34.0)
MCHC: 32.8 g/dL (ref 32.0–36.0)
MCV: 89.7 fL (ref 80.0–100.0)
Platelets: 228 10*3/uL (ref 150–440)
RBC: 5.18 MIL/uL (ref 3.80–5.20)
RDW: 13.1 % (ref 11.5–14.5)
WBC: 9.6 10*3/uL (ref 3.6–11.0)

## 2018-03-10 LAB — ETHANOL

## 2018-03-10 MED ORDER — LORAZEPAM 1 MG PO TABS
1.0000 mg | ORAL_TABLET | Freq: Once | ORAL | Status: AC
  Administered 2018-03-10: 1 mg via ORAL

## 2018-03-10 MED ORDER — LORAZEPAM 1 MG PO TABS
ORAL_TABLET | ORAL | Status: AC
  Filled 2018-03-10: qty 1

## 2018-03-10 MED ORDER — LORAZEPAM 1 MG PO TABS
1.0000 mg | ORAL_TABLET | Freq: Once | ORAL | Status: AC
Start: 2018-03-10 — End: 2018-03-10
  Administered 2018-03-10: 1 mg via ORAL
  Filled 2018-03-10: qty 1

## 2018-03-10 NOTE — ED Triage Notes (Signed)
Patient presents to ED via POV from home. Patient requesting ativan by name for withdrawals. Patient reports taking 100 tabs of 1mg  klonopin that she got filled last Friday. Patient reports attempting to call [facility] "but they dont have any beds so I came here to get help with the withdrawls". When asked what symptoms she was having patient reports "I just feel sick and im real shaky". No tremors noted at this time. Denies HI or SI.

## 2018-03-10 NOTE — BH Assessment (Signed)
Referral update:    [facility] [phone]): information has not yet been reviewed   [facility] [phone]): unit in crisis; information has not yet been reviewed, call back in two hours

## 2018-03-10 NOTE — BH Assessment (Signed)
Assessment Note  Destiny Simmons is an 47 y.o. female who presents to the ER, seeking assistance with detox from her "Benzo's" use. She states she has used on a daily basis for an extended period of time. She has suffered with her addiction for years and have been inpatient multiple times but continues to relapse. She reports the longest she's been sober is twelve months. This was when she was on probation. She currently abuses, "benzo's, pain pills and weed (cannabis)."    Symptoms of withdrawal includes; aches and pains, running nose, diarrhea, lack of energy and unable to sleep.  She reports of having a seizure and it was approximately four years ago. It was a result from an intentional overdose.  During the interview, the patient was calm, cooperative and pleasant. She was able to provide appropriate answers to the questions. She denies involvement with the legal system. She also denies having a history of violence and aggression. She denies SI/HI and AV/H.  Diagnosis: Substance Use Disorder  Past Medical History:  Past Medical History:  Diagnosis Date  . ADHD (attention deficit hyperactivity disorder)   . Anxiety   . Arthritis   . Asthma   . Bipolar disorder (HCC)   . COPD (chronic obstructive pulmonary disease) (HCC)   . Depression   . History of borderline personality disorder   . Malrotation of intestine   . PTSD (post-traumatic stress disorder)   . Sleep apnea   . Thyroid disease 10/11/2017    Past Surgical History:  Procedure Laterality Date  . ABDOMINAL HYSTERECTOMY    . ABDOMINAL SURGERY    . COLONOSCOPY W/ BIOPSIES  04/02/2015   2 sessile polyps sigmoid colon  . ESOPHAGOGASTRODUODENOSCOPY ENDOSCOPY  04/02/2015   localized mildly erythomatous mucosa w/o active bleeding found in duodenal buld    Family History:  Family History  Problem Relation Age of Onset  . Diabetes Father   . Alcohol abuse Father   . Anxiety disorder Father   . Depression Father   . Cancer  Paternal Aunt        unknown cancer  . Cancer Paternal Uncle        unknown cancer  . Heart disease Paternal Grandfather   . OCD Sister   . Anxiety disorder Sister   . Depression Sister   . Bipolar disorder Sister   . Diabetes Sister   . ADD / ADHD Daughter   . Bipolar disorder Daughter   . ADD / ADHD Grandchild   . Anxiety disorder Son   . Depression Son     Social History:  reports that she has been smoking cigarettes.  She has been smoking about 1.00 pack per day. she has never used smokeless tobacco. She reports that she uses drugs. Drugs: Marijuana and Benzodiazepines. She reports that she does not drink alcohol.  Additional Social History:  Alcohol / Drug Use Pain Medications: See PTA Prescriptions: See PTA Over the Counter: See PTA History of alcohol / drug use?: Yes Longest period of sobriety (when/how long): "A year, when I was on probation." Negative Consequences of Use: Personal relationships, Work / School Withdrawal Symptoms: Diarrhea, Fever / Chills, Irritability, Tremors, Nausea / Vomiting Substance #1 Name of Substance 1: "Benzo's" 1 - Last Use / Amount: 03/10/2018 Substance #2 Name of Substance 2: Cannabis 2 - Last Use / Amount: 03/10/2018 Substance #3 Name of Substance 3: "Pain Pills" 3 - Last Use / Amount: 03/09/2018  CIWA: CIWA-Ar BP: (!) 140/96 Pulse Rate: 91 COWS:  Clinical Opiate Withdrawal Scale (COWS) Resting Pulse Rate: Pulse Rate 81-100 Sweating: No report of chills or flushing Restlessness: Able to sit still Pupil Size: Pupils pinned or normal size for room light Bone or Joint Aches: Mild diffuse discomfort Runny Nose or Tearing: Not present GI Upset: No GI symptoms Tremor: No tremor Yawning: No yawning Anxiety or Irritability: Patient reports increasing irritability or anxiousness Gooseflesh Skin: Skin is smooth COWS Total Score: 3  Allergies:  Allergies  Allergen Reactions  . Fluocinolone Itching  . Keflex [Cephalexin] Hives,  Itching, Rash and Other (See Comments)    Unknown reaction, but not anaphylaxis  . Latex Rash and Itching  . Penicillins Rash    Unknown; as a child Pt unsure of reaction, occurred when she was young  . Ciprofloxacin Hives and Itching    cipro  . Dog Epithelium Itching and Cough  . Dyclonine Other (See Comments)    Blisters   . Hydrocodone-Acetaminophen Hives, Itching, Nausea Only and Nausea And Vomiting    Itchting, nausea Itchting, nausea  . Lamotrigine     Blisters  . Meperidine Nausea And Vomiting and Other (See Comments)  . Propoxyphene Nausea And Vomiting  . Vicodin [Hydrocodone-Acetaminophen] Hives, Itching, Nausea Only and Nausea And Vomiting    Itchting, nausea  . Codeine Diarrhea and Nausea And Vomiting  . Pollen Extract Itching and Rash  . Tape Rash    Home Medications:  (Not in a hospital admission)  OB/GYN Status:  No LMP recorded. Patient has had a hysterectomy.  General Assessment Data Location of Assessment: [facility] ED TTS Assessment: In system Is this a Tele or Face-to-Face Assessment?: Face-to-Face Is this an Initial Assessment or a Re-assessment for this encounter?: Initial Assessment Marital status: Married [place] name: n/a Is patient pregnant?: No Living Arrangements: Spouse/significant other Can pt return to current living arrangement?: Yes Admission Status: Voluntary Is patient capable of signing voluntary admission?: Yes Referral Source: Self/Family/Friend Insurance type: [facility]  Medical Screening Exam [facility] Walk-in ONLY) Medical Exam completed: Yes  Crisis Care Plan Living Arrangements: Spouse/significant other Legal Guardian: Other:(Self) Name of Psychiatrist: Dr. Dolores Frame Name of Therapist: Reports of none  Education Status Is patient currently in school?: No Is the patient employed, unemployed or receiving disability?: Unemployed  Risk to self with the past 6 months Suicidal Ideation: No Has patient been a risk to self within the  past 6 months prior to admission? : No Suicidal Intent: No Has patient had any suicidal intent within the past 6 months prior to admission? : No Is patient at risk for suicide?: No Suicidal Plan?: No Has patient had any suicidal plan within the past 6 months prior to admission? : No Access to Means: No What has been your use of drugs/alcohol within the last 12 months?: Cannabis, Benzo's & Pain Pills Previous Attempts/Gestures: Yes How many times?: 12 Other Self Harm Risks: Active addiction Triggers for Past Attempts: None known Intentional Self Injurious Behavior: None Family Suicide History: Unknown Recent stressful life event(s): Other (Comment), Financial Problems Persecutory voices/beliefs?: No Depression: Yes Depression Symptoms: Tearfulness, Fatigue, Guilt, Feeling worthless/self pity Substance abuse history and/or treatment for substance abuse?: Yes Suicide prevention information given to non-admitted patients: Not applicable  Risk to Others within the past 6 months Homicidal Ideation: No Does patient have any lifetime risk of violence toward others beyond the six months prior to admission? : No Thoughts of Harm to Others: No Current Homicidal Intent: No Current Homicidal Plan: No Access to Homicidal Means: No Identified  Victim: Reports of none History of harm to others?: No Assessment of Violence: None Noted Violent Behavior Description: Reports of none Does patient have access to weapons?: No Criminal Charges Pending?: No Does patient have a court date: No Is patient on probation?: No  Psychosis Hallucinations: None noted Delusions: None noted  Mental Status Report Appearance/Hygiene: In scrubs, Unremarkable Eye Contact: Fair Motor Activity: Freedom of movement, Unremarkable Speech: Logical/coherent, Unremarkable Level of Consciousness: Alert Mood: Depressed, Anxious, Sad, Pleasant Affect: Appropriate to circumstance, Depressed, Sad Anxiety Level:  Minimal Thought Processes: Coherent, Relevant Judgement: Unimpaired Orientation: Person, Place, Time, Situation, Appropriate for developmental age Obsessive Compulsive Thoughts/Behaviors: Minimal  Cognitive Functioning Concentration: Normal Memory: Recent Intact, Remote Intact Is patient IDD: No Is patient DD?: No Insight: Fair Impulse Control: Fair Appetite: Poor Have you had any weight changes? : No Change Sleep: Decreased Total Hours of Sleep: 2 Vegetative Symptoms: None  ADLScreening [facility] Assessment Services) Patient's cognitive ability adequate to safely complete daily activities?: Yes Patient able to express need for assistance with ADLs?: Yes Independently performs ADLs?: Yes (appropriate for developmental age)  Prior Inpatient Therapy Prior Inpatient Therapy: Yes Prior Therapy Dates: Multiple Hospitalizations Prior Therapy Facilty/Provider(s): Multiple Hospitalizations Reason for Treatment: Depression & Substance Abuse  Prior Outpatient Therapy Prior Outpatient Therapy: Yes Prior Therapy Dates: Currently Prior Therapy Facilty/Provider(s): Dolores Frame Reason for Treatment: Depression & Anxiety Does patient have an ACCT team?: No Does patient have Intensive In-House Services?  : No Does patient have [facility] services? : No Does patient have P4CC services?: No  ADL Screening (condition at time of admission) Patient's cognitive ability adequate to safely complete daily activities?: Yes Does the patient have difficulty seeing, even when wearing glasses/contacts?: No Does the patient have difficulty concentrating, remembering, or making decisions?: No Patient able to express need for assistance with ADLs?: Yes Does the patient have difficulty dressing or bathing?: No Independently performs ADLs?: Yes (appropriate for developmental age) Does the patient have difficulty walking or climbing stairs?: No Weakness of Legs: None Weakness of Arms/Hands: None  Home  Assistive Devices/Equipment Home Assistive Devices/Equipment: None  Therapy Consults (therapy consults require a physician order) PT Evaluation Needed: No OT Evalulation Needed: No SLP Evaluation Needed: No       Advance Directives (For Healthcare) Does Patient Have a Medical Advance Directive?: No       Child/Adolescent Assessment Running Away Risk: Denies(Patient is an adult)  Disposition:  Disposition Initial Assessment Completed for this Encounter: Yes  On Site Evaluation by:   Reviewed with Physician:    Lilyan Gilford MS, LCAS, LPC, NCC, CCSI Therapeutic Triage Specialist 03/10/2018 5:21 PM

## 2018-03-10 NOTE — BH Assessment (Signed)
Per the request of patient, writer faxed referral information to    [facility] ([phone]), patient declined due to her insurance. Patient will have to pay an out of network co-pay. Writer called [facility] and informed them of the patient's decision.   [facility] ([phone]), information haven't been received.   [facility] [phone]), Unable to reach anyone.

## 2018-03-10 NOTE — ED Provider Notes (Signed)
[facility] Emergency Department Provider Note   ____________________________________________   First MD Initiated Contact with Patient 03/10/18 1613     (approximate)  I have reviewed the triage vital signs and the nursing notes.   HISTORY  Chief Complaint Other (Detox)   HPI Destiny Simmons is a 47 y.o. female Who reports she's taken 100 tablets of 1 mg Klonopin in the last 3 days. She is worried about withdrawal. She denied homicidal or suicidal ideation. tox screen is positive also for opioids cocaine and marijuana. she has says she feels shaky but she does not look shaky at all in fact she looks slightly slowed.   Past Medical History:  Diagnosis Date  . ADHD (attention deficit hyperactivity disorder)   . Anxiety   . Arthritis   . Asthma   . Bipolar disorder (HCC)   . COPD (chronic obstructive pulmonary disease) (HCC)   . Depression   . History of borderline personality disorder   . Malrotation of intestine   . PTSD (post-traumatic stress disorder)   . Sleep apnea   . Thyroid disease 10/11/2017    Patient Active Problem List   Diagnosis Date Noted  . Anxiety 10/11/2017  . Arthritis 10/11/2017  . Asthma without status asthmaticus 10/11/2017  . Borderline personality disorder (HCC) 10/11/2017  . Clinical depression 10/11/2017  . Degenerative disc disease, lumbar 10/11/2017  . Narrowing of intervertebral disc space 10/11/2017  . 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  . Depression 10/11/2017  . Headache, migraine 10/11/2017  . Histrionic behavior 10/11/2017  . Thyroid disease 10/11/2017  . Obstructive apnea 10/11/2017  . Schizo-affective psychosis (HCC) 10/11/2017  . Compulsive tobacco user syndrome 10/11/2017  . Colitis   . Colitis, chronic, ulcerative (HCC) 03/01/2017  . Epigastric pain 03/31/2016  . Anxiety and depression 03/18/2016  . GERD (gastroesophageal reflux disease) 03/18/2016  . Tobacco use 03/18/2016  . Somnolence 03/14/2016    . Periodic limb movements of sleep 03/02/2016  . Moderate episode of recurrent major depressive disorder (HCC) 01/16/2016  . 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 Generalized abdominal pain 12/18/2015  . Chronic constipation 12/18/2015  . Chronic bilateral low back pain with left-sided sciatica 12/16/2015  . H/O drug abuse 12/16/2015  . Smoking trying to quit 12/16/2015  . Pulmonary emphysema (HCC) 11/28/2015  . Intestinal malrotation 05/29/2015  . Non-intractable cyclical vomiting with nausea 05/29/2015  . IBS (irritable bowel syndrome) 05/29/2015  . Back pain 07/20/2013  . Insomnia 07/20/2013  . Hyperlipidemia 07/20/2013  . History of drug abuse in remission 07/20/2013  . History of cardiac arrhythmia 07/20/2013  . Thyroid nodule 07/20/2013  . Weight gain 07/20/2013    Past Surgical History:  Procedure Laterality Date  . ABDOMINAL HYSTERECTOMY    . ABDOMINAL SURGERY    . COLONOSCOPY W/ BIOPSIES  04/02/2015   2 sessile polyps sigmoid colon  . ESOPHAGOGASTRODUODENOSCOPY ENDOSCOPY  04/02/2015   localized mildly erythomatous mucosa w/o active bleeding found in duodenal buld    Prior to Admission medications   Medication Sig Start Date End Date Taking? Authorizing Provider  ABILIFY MAINTENA 300 MG PRSY prefilled syringe Inject 300 mg as directed every 30 (thirty) days. 03/06/18  Yes [provider]  albuterol (PROAIR HFA) 108 (90 Base) MCG/ACT inhaler Inhale 2 puffs into the lungs every 6 (six) hours as needed. For wheezing. 12/16/15  Yes [provider]  DULoxetine (CYMBALTA) 20 MG capsule Take 1 capsule (20 mg total) by mouth 2 (two) times daily. Patient taking  differently: Take 120 mg by mouth daily.  10/11/17  Yes Jomarie Longs, MD  gabapentin (NEURONTIN) 600 MG tablet Take 600 mg by mouth 4 (four) times daily.  02/20/16 03/10/18 Yes [provider]  hydrOXYzine (VISTARIL) 25 MG capsule Take 1 capsule (25 mg total) by mouth 3 (three) times daily as  needed for anxiety. 10/11/17  Yes Eappen, Levin Bacon, MD  ondansetron (ZOFRAN ODT) 4 MG disintegrating tablet Take 1 tablet (4 mg total) by mouth every 8 (eight) hours as needed for nausea or vomiting. 02/03/17  Yes Rockne Menghini, MD  rosuvastatin (CRESTOR) 20 MG tablet Take 20 mg by mouth daily.   Yes [provider]  tiZANidine (ZANAFLEX) 4 MG tablet TK 1 T PO TID FOR MUSCLE SPASTICITY 10/03/17  Yes [provider]  traZODone (DESYREL) 50 MG tablet Take 3 tablets (150 mg total) by mouth at bedtime. 10/11/17  Yes Eappen, Levin Bacon, MD  zolpidem (AMBIEN) 5 MG tablet TK 1 T PO IMM B BED FOR SLP 10/03/17  Yes [provider]  Fluticasone-Salmeterol (ADVAIR DISKUS) 500-50 MCG/DOSE AEPB Inhale into the lungs. 12/16/15   [provider]  lurasidone (LATUDA) 40 MG TABS tablet Take 1 tablet (40 mg total) by mouth daily with breakfast. 10/11/17   Jomarie Longs, MD  meloxicam (MOBIC) 15 MG tablet TK 1 T PO D P 10/03/17   [provider]  sulfamethoxazole-trimethoprim (BACTRIM DS,SEPTRA DS) 800-160 MG tablet Take 1 tablet by mouth 2 (two) times daily. Patient not taking: Reported on 03/10/2018 11/21/17   Merrily Brittle, MD    Allergies Fluocinolone; Keflex [cephalexin]; Latex; Penicillins; Ciprofloxacin; Dog epithelium; Dyclonine; Hydrocodone-acetaminophen; Lamotrigine; Meperidine; Propoxyphene; Vicodin [hydrocodone-acetaminophen]; Codeine; Pollen extract; and Tape  Family History  Problem Relation Age of Onset  . Diabetes Father   . Alcohol abuse Father   . Anxiety disorder Father   . Depression Father   . Cancer Paternal Aunt        unknown cancer  . Cancer Paternal Uncle        unknown cancer  . Heart disease Paternal Grandfather   . OCD Sister   . Anxiety disorder Sister   . Depression Sister   . Bipolar disorder Sister   . Diabetes Sister   . ADD / ADHD Daughter   . Bipolar disorder Daughter   . ADD / ADHD Grandchild   . Anxiety disorder Son    . Depression Son     Social History Social History   Tobacco Use  . Smoking status: Current Every Day Smoker    Packs/day: 1.00    Types: Cigarettes  . Smokeless tobacco: Never Used  Substance Use Topics  . Alcohol use: No  . Drug use: Yes    Types: Marijuana, Benzodiazepines    Review of Systems  Constitutional: No fever/chills Eyes: No visual changes. ENT: No sore throat. Cardiovascular: Denies chest pain. Respiratory: Denies shortness of breath. Gastrointestinal: No abdominal pain.  No nausea, no vomiting.  No diarrhea.  No constipation. Genitourinary: Negative for dysuria. Musculoskeletal: Negative for back pain. Skin: Negative for rash. Neurological: Negative for headaches, focal weakness   ____________________________________________   PHYSICAL EXAM:  VITAL SIGNS: ED Triage Vitals [03/10/18 1404]  Enc Vitals Group     BP (!) 140/96     Pulse Rate 91     Resp 17     Temp 97.9 F (36.6 C)     Temp Source Oral     SpO2 97 %     Weight 180 lb (  81.6 kg)     Height 5\' 2"  (1.575 m)     Head Circumference      Peak Flow      Pain Score      Pain Loc      Pain Edu?      Excl. in GC?    Constitutional: Alert and oriented. Well appearing and in no acute distress. Eyes: Conjunctivae are normal.  Head: Atraumatic. Nose: No congestion/rhinnorhea. Mouth/Throat: Mucous membranes are moist.  Oropharynx non-erythematous. Neck: No stridor.   Cardiovascular: Normal rate, regular rhythm. Grossly normal heart sounds.  Good peripheral circulation. Respiratory: Normal respiratory effort.  No retractions. Lungs CTAB. Gastrointestinal: Soft and nontender. No distention. No abdominal bruits. No CVA tenderness. Musculoskeletal: No lower extremity tenderness nor edema.   Neurologic:  Normal speech and language. No gross focal neurologic deficits are appreciated. Skin:  Skin is warm, dry and intact. No rash noted. Psychiatric: Mood and affect are normal. Speech and  behavior are normal.  ____________________________________________   LABS (all labs ordered are listed, but only abnormal results are displayed)  Labs Reviewed  COMPREHENSIVE METABOLIC PANEL - Abnormal; Notable for the following components:      Result Value   Glucose, Bld 101 (*)    BUN 21 (*)    ALT 11 (*)    All other components within normal limits  URINE DRUG SCREEN, QUALITATIVE ([facility] ONLY) - Abnormal; Notable for the following components:   Cocaine Metabolite,Ur [place] POSITIVE (*)    Opiate, Ur Screen POSITIVE (*)    Cannabinoid 50 Ng, Ur [place] POSITIVE (*)    Benzodiazepine, Ur Scrn POSITIVE (*)    All other components within normal limits  ETHANOL  CBC  POC URINE PREG, ED   ____________________________________________  EKG   ____________________________________________  RADIOLOGY  ED MD interpretation:    Official radiology report(s): No results found.  ____________________________________________   PROCEDURES  Procedure(s) performed:   Procedures  Critical Care performed:   ____________________________________________   INITIAL IMPRESSION / ASSESSMENT AND PLAN / ED COURSE  patient wants detox. She is also already admitted to a 13 month program when she gets detox. Calvin from [facility] is working on placement for detox.         ____________________________________________   FINAL CLINICAL IMPRESSION(S) / ED DIAGNOSES  Final diagnoses:  Benzodiazepine abuse [facility](HCC)     ED Discharge Orders    None       Note:  This document was prepared using Dragon voice recognition software and may include unintentional dictation errors.    Arnaldo NatalMalinda, Kemba Hoppes F, MD 03/10/18 1745

## 2018-03-10 NOTE — ED Notes (Signed)
Pt given drink and applesauce at this time

## 2018-03-10 NOTE — ED Notes (Signed)
Pt requesting to leave at this time and states she has too much to do and will come back tomorrow and start the process over again. Will notify MD

## 2018-06-06 ENCOUNTER — Ambulatory Visit (INDEPENDENT_AMBULATORY_CARE_PROVIDER_SITE_OTHER): Payer: 59 | Admitting: Podiatry

## 2018-06-06 ENCOUNTER — Encounter: Payer: Self-pay | Admitting: Podiatry

## 2018-06-06 DIAGNOSIS — L84 Corns and callosities: Secondary | ICD-10-CM | POA: Diagnosis not present

## 2018-06-06 DIAGNOSIS — M2140 Flat foot [pes planus] (acquired), unspecified foot: Secondary | ICD-10-CM | POA: Diagnosis not present

## 2018-06-13 NOTE — Progress Notes (Signed)
   Subjective:  47 year old female presenting today as a new patient with a chief complaint of a painful callus lesion on the left foot that has been present for the past 20 years. Wearing close toed shoes and walking increases the pain. She has had the callus shaved down and applied Vaseline and Eucerin cream for treatment. Patient is here for further evaluation and treatment.   Past Medical History:  Diagnosis Date  . ADHD (attention deficit hyperactivity disorder)   . Anxiety   . Arthritis   . Asthma   . Bipolar disorder (HCC)   . COPD (chronic obstructive pulmonary disease) (HCC)   . Depression   . History of borderline personality disorder   . Malrotation of intestine   . PTSD (post-traumatic stress disorder)   . Sleep apnea   . Thyroid disease 10/11/2017       Objective/Physical Exam General: The patient is alert and oriented x3 in no acute distress.  Dermatology: Hyperkeratotic lesion present on the left forefoot x 2. Pain on palpation with a central nucleated core noted.Skin is warm, dry and supple bilateral lower extremities. Negative for open lesions or macerations.  Vascular: Palpable pedal pulses bilaterally. No edema or erythema noted. Capillary refill within normal limits.  Neurological: Epicritic and protective threshold grossly intact bilaterally.   Musculoskeletal Exam: Range of motion within normal limits to all pedal and ankle joints bilateral. Muscle strength 5/5 in all groups bilateral.  Upon weightbearing there is a medial longitudinal arch collapse bilaterally. Remove foot valgus noted to the bilateral lower extremities with excessive pronation upon mid stance.  Assessment: 1. pes planus left 2. Callus left forefoot x 2   Plan of Care:  1. Patient was evaluated. 2. Excisional debridement of keratoic lesion using a chisel blade was performed without incident. Light dressing applied.  3. Recommended good shoe gear.  4. Return to clinic as  needed.   Felecia ShellingBrent M. , DPM [facility]  Dr. Felecia ShellingBrent M. , DPM    [address]                                        [place], [place] [address]                Office [phone]  Fax [phone]

## 2018-06-21 ENCOUNTER — Ambulatory Visit: Payer: 59 | Attending: Neurology

## 2018-06-21 DIAGNOSIS — R0683 Snoring: Secondary | ICD-10-CM | POA: Insufficient documentation

## 2018-06-21 DIAGNOSIS — F5101 Primary insomnia: Secondary | ICD-10-CM | POA: Diagnosis not present

## 2018-06-21 DIAGNOSIS — E669 Obesity, unspecified: Secondary | ICD-10-CM | POA: Insufficient documentation

## 2018-06-21 DIAGNOSIS — G4730 Sleep apnea, unspecified: Secondary | ICD-10-CM | POA: Diagnosis present

## 2018-06-21 DIAGNOSIS — Z6835 Body mass index (BMI) 35.0-35.9, adult: Secondary | ICD-10-CM | POA: Diagnosis not present

## 2018-06-21 DIAGNOSIS — F319 Bipolar disorder, unspecified: Secondary | ICD-10-CM | POA: Insufficient documentation

## 2018-08-24 ENCOUNTER — Other Ambulatory Visit: Payer: Self-pay | Admitting: Psychiatry

## 2018-08-24 DIAGNOSIS — F410 Panic disorder [episodic paroxysmal anxiety] without agoraphobia: Secondary | ICD-10-CM

## 2018-12-06 ENCOUNTER — Other Ambulatory Visit: Payer: Self-pay | Admitting: Nurse Practitioner

## 2018-12-06 DIAGNOSIS — Z1231 Encounter for screening mammogram for malignant neoplasm of breast: Secondary | ICD-10-CM

## 2021-03-27 ENCOUNTER — Other Ambulatory Visit: Payer: Self-pay | Admitting: Family Medicine

## 2021-03-27 DIAGNOSIS — Z1231 Encounter for screening mammogram for malignant neoplasm of breast: Secondary | ICD-10-CM

## 2021-09-21 ENCOUNTER — Other Ambulatory Visit: Admission: RE | Admit: 2021-09-21 | Payer: Medicare Other | Source: Ambulatory Visit

## 2021-10-05 ENCOUNTER — Other Ambulatory Visit
Admission: RE | Admit: 2021-10-05 | Discharge: 2021-10-05 | Disposition: A | Discharge status: Home / Self Care | Payer: Medicare Other | Source: Ambulatory Visit | Attending: Family Medicine | Admitting: Family Medicine

## 2021-10-05 NOTE — Pre-Procedure Instructions (Signed)
Called patient she was scheduled for a covid test today. She forgot will come in tomorrow . Almyra Brace RN BSN

## 2021-10-06 ENCOUNTER — Other Ambulatory Visit
Admission: RE | Admit: 2021-10-06 | Discharge: 2021-10-06 | Disposition: A | Discharge status: Home / Self Care | Payer: Medicare Other | Source: Ambulatory Visit | Attending: Family Medicine | Admitting: Family Medicine

## 2021-10-06 ENCOUNTER — Other Ambulatory Visit: Payer: Self-pay

## 2021-10-06 DIAGNOSIS — Z01812 Encounter for preprocedural laboratory examination: Secondary | ICD-10-CM | POA: Diagnosis present

## 2021-10-06 DIAGNOSIS — Z20822 Contact with and (suspected) exposure to covid-19: Secondary | ICD-10-CM | POA: Insufficient documentation

## 2021-10-07 ENCOUNTER — Ambulatory Visit: Payer: Medicare Other | Attending: Neurology

## 2021-10-07 DIAGNOSIS — G4733 Obstructive sleep apnea (adult) (pediatric): Secondary | ICD-10-CM | POA: Diagnosis present

## 2021-10-07 DIAGNOSIS — F5101 Primary insomnia: Secondary | ICD-10-CM | POA: Diagnosis not present

## 2021-10-07 LAB — SARS CORONAVIRUS 2 (TAT 6-24 HRS): SARS Coronavirus 2: NEGATIVE

## 2021-10-08 ENCOUNTER — Other Ambulatory Visit: Payer: Self-pay

## 2022-11-19 ENCOUNTER — Other Ambulatory Visit: Payer: Self-pay

## 2022-11-19 ENCOUNTER — Encounter: Payer: Self-pay | Admitting: Emergency Medicine

## 2022-11-19 ENCOUNTER — Emergency Department
Admission: EM | Admit: 2022-11-19 | Discharge: 2022-11-19 | Disposition: A | Discharge status: Home / Self Care | Payer: Medicare Other | Attending: Emergency Medicine | Admitting: Emergency Medicine

## 2022-11-19 DIAGNOSIS — K120 Recurrent oral aphthae: Secondary | ICD-10-CM | POA: Diagnosis not present

## 2022-11-19 DIAGNOSIS — K1370 Unspecified lesions of oral mucosa: Secondary | ICD-10-CM | POA: Diagnosis present

## 2022-11-19 DIAGNOSIS — K148 Other diseases of tongue: Secondary | ICD-10-CM

## 2022-11-19 MED ORDER — NYSTATIN 100000 UNIT/ML MT SUSP: 5.0000 mL | Freq: Four times a day (QID) | OROMUCOSAL | 0 refills | Status: DC | PRN

## 2022-11-19 MED ORDER — LIDOCAINE VISCOUS HCL 2 % MT SOLN
15.0000 mL | Freq: Once | OROMUCOSAL | Status: AC
  Administered 2022-11-19: 15 mL via OROMUCOSAL
  Filled 2022-11-19: qty 15

## 2022-11-19 NOTE — Discharge Instructions (Signed)
Use the Magic mouthwash solution as directed.  You should follow-up with a dental provider for reevaluation and possible biopsy if symptoms persist.  Follow-up with your primary provider or return to the ED if needed.

## 2022-11-19 NOTE — ED Provider Triage Note (Signed)
Emergency Medicine Provider Triage Evaluation Note  Destiny Simmons , a 51 y.o. female  was evaluated in triage.  Pt complains of sore on the right side of her tongue that has progressively gotten larger over about a month's time. Started as a white patch and has grown. She hasn't bitten her tongue that she is aware of. No fever, sore throat or swollen lymph nodes.  Physical Exam  BP (!) 134/99 (BP Location: Left Arm)   Pulse 88   Temp 98.3 F (36.8 C) (Oral)   Resp 18   Ht 5\' 3"  (1.6 m)   Wt 99.8 kg   SpO2 97%   BMI 38.97 kg/m  Gen:   Awake, no distress   Resp:  Normal effort  MSK:   Moves extremities without difficulty  Other:    Medical Decision Making  Medically screening exam initiated at 3:59 PM.  Appropriate orders placed.  Destiny Simmons was informed that the remainder of the evaluation will be completed by another provider, this initial triage assessment does not replace that evaluation, and the importance of remaining in the ED until their evaluation is complete.    Volanda Napoleon, FNP 11/19/22 1757

## 2022-11-19 NOTE — ED Provider Notes (Signed)
[facility] Emergency Department Provider Note     Event Date/Time   First MD Initiated Contact with Patient 11/19/22 1616     (approximate)   History   Oral Pain   HPI  Destiny Simmons is a 51 y.o. female to the ED for evaluation of pain to the right lateral side of her tongue.  She reports a spot to the right of sinus feels like it was a hole and is getting bigger.  She reports pain with attempting to eat and swallow but she denies difficulty breathing, swallowing, or controlling oral secretions.     Physical Exam   Triage Vital Signs: ED Triage Vitals  Enc Vitals Group     BP 11/19/22 1531 (!) 134/99     Pulse Rate 11/19/22 1531 88     Resp 11/19/22 1531 18     Temp 11/19/22 1531 98.3 F (36.8 C)     Temp Source 11/19/22 1531 Oral     SpO2 11/19/22 1531 97 %     Weight 11/19/22 1531 220 lb (99.8 kg)     Height 11/19/22 1531 5\' 3"  (1.6 m)     Head Circumference --      Peak Flow --      Pain Score 11/19/22 1558 8     Pain Loc --      Pain Edu? --      Excl. in GC? --     Most recent vital signs: Vitals:   11/19/22 1531  BP: (!) 134/99  Pulse: 88  Resp: 18  Temp: 98.3 F (36.8 C)  SpO2: 97%    General Awake, no distress. NAD HEENT NCAT. PERRL. EOMI. No rhinorrhea. Mucous membranes are moist. Right lateral tongue with a single ulcerated lesion noted, approx 0.3 cm. No geographic tongue, plaques, or evidence of thrush. No oropharyngeal lesions noted.  CV:  Good peripheral perfusion.  RESP:  Normal effort.  ABD:  No distention.    ED Results / Procedures / Treatments   Labs (all labs ordered are listed, but only abnormal results are displayed) Labs Reviewed - No data to display   EKG    RADIOLOGY  No results found.   PROCEDURES:  Critical Care performed: No  Procedures   MEDICATIONS ORDERED IN ED: Medications  lidocaine (XYLOCAINE) 2 % viscous mouth solution 15 mL (15 mLs Mouth/Throat Given by Other  11/19/22 1755)     IMPRESSION / MDM / ASSESSMENT AND PLAN / ED COURSE  I reviewed the triage vital signs and the nursing notes.                              Differential diagnosis includes, but is not limited to, thrush, aphthous ulcer, leukoplakia, tonsillitis, dental pain  Patient's presentation is most consistent with acute, uncomplicated illness.  Patient's diagnosis is consistent with aphthous ulcer. Patient will be discharged home with prescriptions for Magic mouthwash. Patient is to follow up with dental provider as needed or otherwise directed. Patient is given ED precautions to return to the ED for any worsening or new symptoms.     FINAL CLINICAL IMPRESSION(S) / ED DIAGNOSES   Final diagnoses:  Tongue lesion  Aphthous ulcer of tongue     Rx / DC Orders   ED Discharge Orders          Ordered    magic mouthwash (nystatin, lidocaine, diphenhydrAMINE, alum & mag hydroxide) suspension  4 times daily PRN        11/19/22 1738             Note:  This document was prepared using Dragon voice recognition software and may include unintentional dictation errors.    Lissa Hoard, PA-C 11/21/22 2252    Sharyn Creamer, MD 12/13/22 [phone]

## 2022-11-19 NOTE — ED Triage Notes (Signed)
Pt here with mouth pain. Pt states she has a spot on the right side of her tongue that has a hole that feels like it is getting bigger. Pt states it is hard to eat.

## 2023-08-24 ENCOUNTER — Encounter: Payer: Self-pay | Admitting: Medical Oncology

## 2023-08-24 ENCOUNTER — Emergency Department
Admission: EM | Admit: 2023-08-24 | Discharge: 2023-08-24 | Disposition: A | Discharge status: Other Acute Inpatient Hospital | Payer: 59 | Attending: Emergency Medicine | Admitting: Emergency Medicine

## 2023-08-24 ENCOUNTER — Inpatient Hospital Stay (HOSPITAL_COMMUNITY)
Admission: AD | Admit: 2023-08-24 | Discharge: 2023-08-29 | DRG: 885 | Disposition: A | Discharge status: Home / Self Care | Payer: 59 | Source: Other Acute Inpatient Hospital | Attending: Psychiatry | Admitting: Psychiatry

## 2023-08-24 ENCOUNTER — Other Ambulatory Visit: Payer: Self-pay

## 2023-08-24 ENCOUNTER — Encounter (HOSPITAL_COMMUNITY): Payer: Self-pay

## 2023-08-24 DIAGNOSIS — R7303 Prediabetes: Secondary | ICD-10-CM | POA: Diagnosis present

## 2023-08-24 DIAGNOSIS — J449 Chronic obstructive pulmonary disease, unspecified: Secondary | ICD-10-CM | POA: Insufficient documentation

## 2023-08-24 DIAGNOSIS — F1410 Cocaine abuse, uncomplicated: Secondary | ICD-10-CM | POA: Diagnosis present

## 2023-08-24 DIAGNOSIS — F603 Borderline personality disorder: Secondary | ICD-10-CM | POA: Diagnosis not present

## 2023-08-24 DIAGNOSIS — Z79899 Other long term (current) drug therapy: Secondary | ICD-10-CM

## 2023-08-24 DIAGNOSIS — F4310 Post-traumatic stress disorder, unspecified: Secondary | ICD-10-CM | POA: Diagnosis present

## 2023-08-24 DIAGNOSIS — I10 Essential (primary) hypertension: Secondary | ICD-10-CM | POA: Diagnosis present

## 2023-08-24 DIAGNOSIS — F17210 Nicotine dependence, cigarettes, uncomplicated: Secondary | ICD-10-CM | POA: Diagnosis not present

## 2023-08-24 DIAGNOSIS — F1220 Cannabis dependence, uncomplicated: Secondary | ICD-10-CM | POA: Diagnosis present

## 2023-08-24 DIAGNOSIS — Z811 Family history of alcohol abuse and dependence: Secondary | ICD-10-CM | POA: Diagnosis not present

## 2023-08-24 DIAGNOSIS — F419 Anxiety disorder, unspecified: Secondary | ICD-10-CM | POA: Diagnosis present

## 2023-08-24 DIAGNOSIS — R45851 Suicidal ideations: Secondary | ICD-10-CM | POA: Diagnosis present

## 2023-08-24 DIAGNOSIS — F411 Generalized anxiety disorder: Secondary | ICD-10-CM | POA: Insufficient documentation

## 2023-08-24 DIAGNOSIS — F17213 Nicotine dependence, cigarettes, with withdrawal: Secondary | ICD-10-CM | POA: Diagnosis present

## 2023-08-24 DIAGNOSIS — Z833 Family history of diabetes mellitus: Secondary | ICD-10-CM | POA: Diagnosis not present

## 2023-08-24 DIAGNOSIS — F332 Major depressive disorder, recurrent severe without psychotic features: Principal | ICD-10-CM | POA: Diagnosis present

## 2023-08-24 DIAGNOSIS — F1310 Sedative, hypnotic or anxiolytic abuse, uncomplicated: Secondary | ICD-10-CM | POA: Diagnosis present

## 2023-08-24 DIAGNOSIS — Z791 Long term (current) use of non-steroidal anti-inflammatories (NSAID): Secondary | ICD-10-CM

## 2023-08-24 DIAGNOSIS — Z8249 Family history of ischemic heart disease and other diseases of the circulatory system: Secondary | ICD-10-CM

## 2023-08-24 DIAGNOSIS — M1990 Unspecified osteoarthritis, unspecified site: Secondary | ICD-10-CM | POA: Diagnosis present

## 2023-08-24 DIAGNOSIS — Z818 Family history of other mental and behavioral disorders: Secondary | ICD-10-CM | POA: Diagnosis not present

## 2023-08-24 DIAGNOSIS — E039 Hypothyroidism, unspecified: Secondary | ICD-10-CM | POA: Diagnosis present

## 2023-08-24 DIAGNOSIS — G4730 Sleep apnea, unspecified: Secondary | ICD-10-CM | POA: Diagnosis present

## 2023-08-24 DIAGNOSIS — E785 Hyperlipidemia, unspecified: Secondary | ICD-10-CM | POA: Diagnosis present

## 2023-08-24 DIAGNOSIS — Z91410 Personal history of adult physical and sexual abuse: Secondary | ICD-10-CM

## 2023-08-24 DIAGNOSIS — Z86010 Personal history of colonic polyps: Secondary | ICD-10-CM

## 2023-08-24 DIAGNOSIS — G4700 Insomnia, unspecified: Secondary | ICD-10-CM | POA: Diagnosis present

## 2023-08-24 DIAGNOSIS — F909 Attention-deficit hyperactivity disorder, unspecified type: Secondary | ICD-10-CM | POA: Diagnosis present

## 2023-08-24 DIAGNOSIS — J4489 Other specified chronic obstructive pulmonary disease: Secondary | ICD-10-CM | POA: Diagnosis present

## 2023-08-24 DIAGNOSIS — F1510 Other stimulant abuse, uncomplicated: Secondary | ICD-10-CM | POA: Diagnosis present

## 2023-08-24 DIAGNOSIS — Z720 Tobacco use: Secondary | ICD-10-CM | POA: Diagnosis present

## 2023-08-24 DIAGNOSIS — Z634 Disappearance and death of family member: Secondary | ICD-10-CM

## 2023-08-24 DIAGNOSIS — Z90710 Acquired absence of both cervix and uterus: Secondary | ICD-10-CM | POA: Diagnosis not present

## 2023-08-24 LAB — CBC
HCT: 43.6 % (ref 36.0–46.0)
Hemoglobin: 13.2 g/dL (ref 12.0–15.0)
MCH: 26.1 pg (ref 26.0–34.0)
MCHC: 30.3 g/dL (ref 30.0–36.0)
MCV: 86.3 fL (ref 80.0–100.0)
Platelets: 296 10*3/uL (ref 150–400)
RBC: 5.05 MIL/uL (ref 3.87–5.11)
RDW: 14.6 % (ref 11.5–15.5)
WBC: 8.5 10*3/uL (ref 4.0–10.5)
nRBC: 0 % (ref 0.0–0.2)

## 2023-08-24 LAB — COMPREHENSIVE METABOLIC PANEL
ALT: 14 U/L (ref 0–44)
AST: 15 U/L (ref 15–41)
Albumin: 3.9 g/dL (ref 3.5–5.0)
Alkaline Phosphatase: 125 U/L (ref 38–126)
Anion gap: 11 (ref 5–15)
BUN: 14 mg/dL (ref 6–20)
CO2: 25 mmol/L (ref 22–32)
Calcium: 8.8 mg/dL — ABNORMAL LOW (ref 8.9–10.3)
Chloride: 104 mmol/L (ref 98–111)
Creatinine, Ser: 0.84 mg/dL (ref 0.44–1.00)
GFR, Estimated: >60 mL/min (ref >60)
Glucose, Bld: 105 mg/dL — ABNORMAL HIGH (ref 70–99)
Potassium: 3.6 mmol/L (ref 3.5–5.1)
Sodium: 140 mmol/L (ref 135–145)
Total Bilirubin: 0.5 mg/dL (ref 0.3–1.2)
Total Protein: 7.3 g/dL (ref 6.5–8.1)

## 2023-08-24 LAB — URINE DRUG SCREEN, QUALITATIVE (ARMC ONLY)
Amphetamines, Ur Screen: POSITIVE — AB
Barbiturates, Ur Screen: NOT DETECTED
Benzodiazepine, Ur Scrn: NOT DETECTED
Cannabinoid 50 Ng, Ur ~~LOC~~: POSITIVE — AB
Cocaine Metabolite,Ur ~~LOC~~: POSITIVE — AB
MDMA (Ecstasy)Ur Screen: NOT DETECTED
Methadone Scn, Ur: NOT DETECTED
Opiate, Ur Screen: NOT DETECTED
Phencyclidine (PCP) Ur S: NOT DETECTED
Tricyclic, Ur Screen: NOT DETECTED

## 2023-08-24 LAB — ACETAMINOPHEN LEVEL: Acetaminophen (Tylenol), Serum: <10 ug/mL — ABNORMAL LOW (ref 10–30)

## 2023-08-24 LAB — ETHANOL: Alcohol, Ethyl (B): <10 mg/dL (ref <10)

## 2023-08-24 LAB — SALICYLATE LEVEL: Salicylate Lvl: <7 mg/dL — ABNORMAL LOW (ref 7.0–30.0)

## 2023-08-24 MED ORDER — DIPHENHYDRAMINE HCL 25 MG PO CAPS: 50.0000 mg | ORAL_CAPSULE | Freq: Three times a day (TID) | ORAL | Status: DC | PRN

## 2023-08-24 MED ORDER — HYDROXYZINE HCL 50 MG PO TABS
50.0000 mg | ORAL_TABLET | Freq: Three times a day (TID) | ORAL | Status: DC | PRN
  Administered 2023-08-24 – 2023-08-25 (×2): 50 mg via ORAL
  Filled 2023-08-24 (×2): qty 1

## 2023-08-24 MED ORDER — HALOPERIDOL LACTATE 5 MG/ML IJ SOLN: 5.0000 mg | Freq: Three times a day (TID) | INTRAMUSCULAR | Status: DC | PRN

## 2023-08-24 MED ORDER — HALOPERIDOL 5 MG PO TABS: 5.0000 mg | ORAL_TABLET | Freq: Three times a day (TID) | ORAL | Status: DC | PRN

## 2023-08-24 MED ORDER — DIPHENHYDRAMINE HCL 50 MG/ML IJ SOLN: 50.0000 mg | Freq: Three times a day (TID) | INTRAMUSCULAR | Status: DC | PRN

## 2023-08-24 MED ORDER — MAGNESIUM HYDROXIDE 400 MG/5ML PO SUSP: 30.0000 mL | Freq: Every day | ORAL | Status: DC | PRN

## 2023-08-24 MED ORDER — ALUM & MAG HYDROXIDE-SIMETH 200-200-20 MG/5ML PO SUSP: 30.0000 mL | ORAL | Status: DC | PRN

## 2023-08-24 MED ORDER — NICOTINE 21 MG/24HR TD PT24
21.0000 mg | MEDICATED_PATCH | Freq: Every day | TRANSDERMAL | Status: DC
  Administered 2023-08-24 – 2023-08-26 (×3): 21 mg via TRANSDERMAL
  Filled 2023-08-24 (×6): qty 1

## 2023-08-24 MED ORDER — LORAZEPAM 1 MG PO TABS: 2.0000 mg | ORAL_TABLET | Freq: Three times a day (TID) | ORAL | Status: DC | PRN

## 2023-08-24 MED ORDER — TRAZODONE HCL 50 MG PO TABS
50.0000 mg | ORAL_TABLET | Freq: Every evening | ORAL | Status: DC | PRN
  Administered 2023-08-24: 50 mg via ORAL
  Filled 2023-08-24: qty 1

## 2023-08-24 MED ORDER — LORAZEPAM 2 MG/ML IJ SOLN: 2.0000 mg | Freq: Three times a day (TID) | INTRAMUSCULAR | Status: DC | PRN

## 2023-08-24 MED ORDER — HYDROXYZINE HCL 25 MG PO TABS: 50.0000 mg | ORAL_TABLET | Freq: Three times a day (TID) | ORAL | Status: DC | PRN

## 2023-08-24 NOTE — Tx Team (Addendum)
Initial Treatment Plan 08/24/2023 5:43 PM [name]:[id number]    PATIENT STRESSORS: Loss of father   Substance abuse     PATIENT STRENGTHS: Communication skills  Supportive family/friends    PATIENT IDENTIFIED PROBLEMS: " My self esteem"  " Use of drugs"                   DISCHARGE CRITERIA:  Ability to meet basic life and health needs Adequate post-discharge living arrangements Motivation to continue treatment in a less acute level of care  PRELIMINARY DISCHARGE PLAN: Return to previous living arrangement  PATIENT/FAMILY INVOLVEMENT: This treatment plan has been presented to and reviewed with the patient, Destiny Simmons.  The patient and family have been given the opportunity to ask questions and make suggestions.  Rosita Fire, RN 08/24/2023, 5:43 PM
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
## 2023-08-24 NOTE — ED Provider Notes (Signed)
[facility] Provider Note    Event Date/Time   First MD Initiated Contact with Patient 08/24/23 1013     (approximate)   History   Suicidal   HPI Destiny Simmons is a 52 y.o. female with bipolar, MDD, PTSD, anxiety who presents today for suicidal ideations.  Patient states being off her psychiatric medications for a while now.  Her father passed away back in 05/08/2023 and a couple weeks ago her daughter overdosed which is caused significant stress and depression in her life.  She has started having suicidal ideations over the past couple days but no current plan.  Prior suicide attempts by overdosing with pills.  Notes crack cocaine use 2 days ago but no other drug or alcohol use within the past 24 hours.  No recent illnesses.     Physical Exam   Triage Vital Signs: ED Triage Vitals [08/24/23 0926]  Encounter Vitals Group     BP (!) 171/102     Systolic BP Percentile      Diastolic BP Percentile      Pulse Rate 88     Resp 18     Temp 98.3 F (36.8 C)     Temp Source Oral     SpO2 99 %     Weight 218 lb 4.1 oz (99 kg)     Height 5\' 3"  (1.6 m)     Head Circumference      Peak Flow      Pain Score 0     Pain Loc      Pain Education      Exclude from Growth Chart     Most recent vital signs: Vitals:   08/24/23 0926 08/24/23 1049  BP: (!) 171/102 126/77  Pulse: 88 84  Resp: 18 18  Temp: 98.3 F (36.8 C) 98.2 F (36.8 C)  SpO2: 99% 98%   I have reviewed the vital signs. General:  Awake, alert, no acute distress. Head:  Normocephalic, Atraumatic. EENT:  PERRL, EOMI, Oral mucosa pink and moist, Neck is supple. Cardiovascular: Regular rate, 2+ distal pulses. Respiratory:  Normal respiratory effort, symmetrical expansion, no distress.   Extremities:  Moving all four extremities through full ROM without pain.   Neuro:  Alert and oriented.  Interacting appropriately.   Skin:  Warm, dry, no rash.   Psych: Appropriate affect.  Tearful.  Endorses  SI.    ED Results / Procedures / Treatments   Labs (all labs ordered are listed, but only abnormal results are displayed) Labs Reviewed  COMPREHENSIVE METABOLIC PANEL - Abnormal; Notable for the following components:      Result Value   Glucose, Bld 105 (*)    Calcium 8.8 (*)    All other components within normal limits  SALICYLATE LEVEL - Abnormal; Notable for the following components:   Salicylate Lvl <7.0 (*)    All other components within normal limits  ACETAMINOPHEN LEVEL - Abnormal; Notable for the following components:   Acetaminophen (Tylenol), Serum <10 (*)    All other components within normal limits  URINE DRUG SCREEN, QUALITATIVE ([facility] ONLY) - Abnormal; Notable for the following components:   Amphetamines, Ur Screen POSITIVE (*)    Cocaine Metabolite,Ur [place] POSITIVE (*)    Cannabinoid 50 Ng, Ur [place] POSITIVE (*)    All other components within normal limits  ETHANOL  CBC     EKG    RADIOLOGY    PROCEDURES:  Critical Care performed: No  Procedures  MEDICATIONS ORDERED IN ED: Medications  hydrOXYzine (ATARAX) tablet 50 mg (has no administration in time range)     IMPRESSION / MDM / ASSESSMENT AND PLAN / ED COURSE  I reviewed the triage vital signs and the nursing notes.                              Differential diagnosis includes, but is not limited to, suicidal ideation.  Patient's presentation is most consistent with acute presentation with potential threat to life or bodily function.  Patient is a 52 year old female presenting today for suicidal ideation.  No other medical complications and medically cleared with stable labs at this time.  Psychiatry evaluated patient and recommends inpatient admission.  Patient will be admitted to [facility].  No further concerns at this time.  The patient has been placed in psychiatric observation due to the need to provide a safe environment for the patient while obtaining psychiatric  consultation and evaluation, as well as ongoing medical and medication management to treat the patient's condition.  The patient has not been placed under full IVC at this time.  Clinical Course as of 08/24/23 1329  Wed Aug 24, 2023  1013 Medically cleared and safe for psychiatric evaluation. [DW]  1328 Accepted to [facility] [DW]    Clinical Course User Index [DW] Janith Lima, MD     FINAL CLINICAL IMPRESSION(S) / ED DIAGNOSES   Final diagnoses:  Suicidal ideation     Rx / DC Orders   ED Discharge Orders     None        Note:  This document was prepared using Dragon voice recognition software and may include unintentional dictation errors.   Janith Lima, MD 08/24/23 1328

## 2023-08-24 NOTE — ED Triage Notes (Signed)
Pt reports that she has been feeling suicidal for a couple of days. States that she has no plan but has been feeling really depressed and unwell. Pt reports multiple attempts in past. Here with husband. Pt cooperative in triage.

## 2023-08-24 NOTE — ED Notes (Signed)
Pt stating father passed away in April/May as well as one daughter OD a couple weeks ago, saying she turned blue but was resuscitated. Pt also endorses smoking a lot of crack as recent as two days ago. Yesterday smoked marijuana. Pt also states not been taking making meds appropriately.
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
## 2023-08-24 NOTE — Progress Notes (Signed)
Patient is a 52 year old female, voluntarily admitted to unit from [facility] ED due to passive suicidal ideation. Pt noted to be pleasant, anxious, depressed and cooperative during admission assessment. Pt stated, " I've been feeling really suicidal the last couple of days". Pt stated that she missed her Father  who passed away in 2023/05/21.She also stated that and one of her daughters  overdosed on drugs a few weeks ago and her son passed away via OD 5 years ago. Pt voiced prior suicide attempt by overdosing on drugs, "years ago". Patient denies current suicidal thoughts, denies HI and AVH. Skin and safety check completed. Tattoos observed to left lower leg and right upper arm, callus observed to pt's left foot. No contraband found. Pt escorted to unit by RN and orientation  of the milieu provided. Safety maintained.  08/24/23 1545  Psych Admission Type (Psych Patients Only)  Admission Status Voluntary  Psychosocial Assessment  Patient Complaints Anxiety;Depression;Crying spells;Panic attack  Eye Contact Fair  Facial Expression Anxious;Sad  Affect Anxious;Depressed;Sad  Speech Logical/coherent  Interaction Assertive  Motor Activity Slow  Appearance/Hygiene In scrubs  Behavior Characteristics Cooperative;Anxious  Mood Depressed;Anxious  Thought Process  Coherency WDL  Content WDL  Delusions None reported or observed  Perception WDL  Hallucination None reported or observed  Judgment Poor  Confusion None  Danger to Self  Current suicidal ideation? Denies  Danger to Others  Danger to Others None reported or observed

## 2023-08-24 NOTE — ED Notes (Signed)
Safe  transport  called  for  transfer  to  [facility]

## 2023-08-24 NOTE — BH Assessment (Signed)
Patient has been accepted to [facility] on today 08/24/23. Patient assigned to room 300, bed# 2. Accepting physician is Dr. Sherron Flemings.  Call report to [phone].  Representative was [employer].   ER Staff is aware of it:  Misty Stanley, ER Secretary  Dr. Anner Crete, ER MD  Ian Malkin, Patient's Nurse     Patient's Family/Support System Nikea Wolter [phone]) has been updated as well.

## 2023-08-24 NOTE — ED Notes (Signed)
Pt given lunch tray and beverage 

## 2023-08-24 NOTE — Plan of Care (Signed)
  Problem: Education: Goal: Knowledge of [place] [facility] Education information/materials will improve Outcome: Progressing   

## 2023-08-24 NOTE — ED Notes (Signed)
Pt's husband is taking belongings as requested by the pt.  Pt has 4 rings (3 on left and 1 on right hand) that wont come off her fingers.

## 2023-08-25 DIAGNOSIS — F332 Major depressive disorder, recurrent severe without psychotic features: Secondary | ICD-10-CM | POA: Diagnosis not present

## 2023-08-25 MED ORDER — ALBUTEROL SULFATE HFA 108 (90 BASE) MCG/ACT IN AERS: 1.0000 | INHALATION_SPRAY | Freq: Four times a day (QID) | RESPIRATORY_TRACT | Status: DC | PRN

## 2023-08-25 MED ORDER — IBUPROFEN 600 MG PO TABS
600.0000 mg | ORAL_TABLET | Freq: Four times a day (QID) | ORAL | Status: DC | PRN
  Administered 2023-08-25 – 2023-08-29 (×8): 600 mg via ORAL
  Filled 2023-08-25 (×8): qty 1

## 2023-08-25 MED ORDER — HYDROCHLOROTHIAZIDE 12.5 MG PO TABS
12.5000 mg | ORAL_TABLET | Freq: Every morning | ORAL | Status: DC
  Administered 2023-08-25 – 2023-08-28 (×4): 12.5 mg via ORAL
  Filled 2023-08-25 (×7): qty 1

## 2023-08-25 MED ORDER — PAROXETINE HCL 20 MG PO TABS
20.0000 mg | ORAL_TABLET | Freq: Every day | ORAL | Status: DC
  Administered 2023-08-25: 20 mg via ORAL
  Filled 2023-08-25 (×3): qty 1

## 2023-08-25 MED ORDER — DIPHENHYDRAMINE HCL 50 MG PO CAPS
50.0000 mg | ORAL_CAPSULE | Freq: Once | ORAL | Status: AC
  Administered 2023-08-25: 50 mg via ORAL
  Filled 2023-08-25: qty 1

## 2023-08-25 MED ORDER — CLONIDINE HCL 0.2 MG PO TABS
0.2000 mg | ORAL_TABLET | Freq: Once | ORAL | Status: AC
  Administered 2023-08-25: 0.2 mg via ORAL
  Filled 2023-08-25: qty 1

## 2023-08-25 MED ORDER — RAMELTEON 8 MG PO TABS
8.0000 mg | ORAL_TABLET | Freq: Every day | ORAL | Status: DC
  Administered 2023-08-25 – 2023-08-27 (×3): 8 mg via ORAL
  Filled 2023-08-25 (×5): qty 1

## 2023-08-25 MED ORDER — CELECOXIB 100 MG PO CAPS
100.0000 mg | ORAL_CAPSULE | Freq: Every day | ORAL | Status: DC
  Administered 2023-08-25 – 2023-08-29 (×5): 100 mg via ORAL
  Filled 2023-08-25 (×7): qty 1

## 2023-08-25 MED ORDER — BREXPIPRAZOLE 2 MG PO TABS
2.0000 mg | ORAL_TABLET | Freq: Every day | ORAL | Status: DC
  Administered 2023-08-26 – 2023-08-29 (×4): 2 mg via ORAL
  Filled 2023-08-25 (×5): qty 1

## 2023-08-25 MED ORDER — FLUOXETINE HCL 20 MG PO CAPS
20.0000 mg | ORAL_CAPSULE | Freq: Every day | ORAL | Status: DC
  Administered 2023-08-26 – 2023-08-29 (×4): 20 mg via ORAL
  Filled 2023-08-25 (×5): qty 1

## 2023-08-25 MED ORDER — ATORVASTATIN CALCIUM 40 MG PO TABS
40.0000 mg | ORAL_TABLET | Freq: Every day | ORAL | Status: DC
  Administered 2023-08-25 – 2023-08-29 (×5): 40 mg via ORAL
  Filled 2023-08-25 (×7): qty 1

## 2023-08-25 MED ORDER — BREXPIPRAZOLE 1 MG PO TABS
1.0000 mg | ORAL_TABLET | Freq: Every day | ORAL | Status: DC
  Administered 2023-08-25: 1 mg via ORAL
  Filled 2023-08-25 (×3): qty 1

## 2023-08-25 MED ORDER — ONDANSETRON HCL 4 MG PO TABS: 4.0000 mg | ORAL_TABLET | Freq: Three times a day (TID) | ORAL | Status: DC | PRN

## 2023-08-25 NOTE — BHH Suicide Risk Assessment (Signed)
Suicide Risk Assessment  Admission Assessment    [facility] Admission Suicide Risk Assessment   Nursing information obtained from:  Patient  Demographic factors: 52 year old, Caucasian female, married, currently collecting disability checks.  Current Mental Status: Alert, oriented x 3 & aware of situation.  Loss Factors: Father died in 05-18-2023, son died of drug of dose 5 years ago, daughter almost died from drug overdose 2 weeks ago.  Historical Factors:  Prior suicide attempts  Risk Reduction Factors:  Sense of responsibility to family  Total Time spent with patient: 1 hour  Principal Problem: Major depressive disorder, recurrent episode, severe (HCC)  Diagnosis:  Principal Problem:   Major depressive disorder, recurrent episode, severe (HCC)  Subjective Data: See H&P  Continued Clinical Symptoms:  Alcohol Use Disorder Identification Test Final Score (AUDIT): 0 The "Alcohol Use Disorders Identification Test", Guidelines for Use in Primary Care, Second Edition.  World Science writer [facility]). Score between 0-7:  no or low risk or alcohol related problems. Score between 8-15:  moderate risk of alcohol related problems. Score between 16-19:  high risk of alcohol related problems. Score 20 or above:  warrants further diagnostic evaluation for alcohol dependence and treatment.  CLINICAL FACTORS:   Severe Anxiety and/or Agitation Bipolar Disorder:   Depressive phase Alcohol/Substance Abuse/Dependencies More than one psychiatric diagnosis Unstable or Poor Therapeutic Relationship Previous Psychiatric Diagnoses and Treatments Medical Diagnoses and Treatments/Surgeries  Musculoskeletal: Strength & Muscle Tone: within normal limits Gait & Station: normal Patient leans: N/A  Psychiatric Specialty Exam:  Presentation  General Appearance:  Appropriate for Environment  Eye Contact: Good  Speech: Clear and Coherent  Speech Volume: Normal  Handedness:No data  recorded  Mood and Affect  Mood: Depressed; Hopeless; Worthless  Affect: Congruent; Depressed; Flat; Tearful   Thought Process  Thought Processes: Coherent  Descriptions of Associations:Intact  Orientation:Full (Time, Place and Person)  Thought Content:Logical  History of Schizophrenia/Schizoaffective disorder:No  Duration of Psychotic Symptoms:No data recorded Hallucinations:Hallucinations: None  Ideas of Reference:None  Suicidal Thoughts:Suicidal Thoughts: Yes, Active SI Active Intent and/or Plan: With Intent  Homicidal Thoughts:Homicidal Thoughts: No   Sensorium  Memory: Immediate Good; Recent Good  Judgment: Poor  Insight: Fair   Art therapist  Concentration: Fair  Attention Span: Good  Recall: Good  Fund of Knowledge: Good  Language:No data recorded  Psychomotor Activity  Psychomotor Activity: Psychomotor Activity: Normal   Assets  Assets: Communication Skills; Desire for Improvement; Housing; Resilience; Social Support  Sleep  Sleep: Sleep: Poor  Physical Exam: See H&P. Blood pressure (!) 150/94, pulse 92, temperature 97.9 F (36.6 C), temperature source Oral, resp. rate 16, height 5\' 2"  (1.575 m), weight 99.8 kg, SpO2 99%. Body mass index is 40.24 kg/m.  COGNITIVE FEATURES THAT CONTRIBUTE TO RISK:  Closed-mindedness, Polarized thinking, and Thought constriction (tunnel vision)    SUICIDE RISK:   Severe:  Frequent, intense, and enduring suicidal ideation, specific plan, no subjective intent, but some objective markers of intent (i.e., choice of lethal method), the method is accessible, some limited preparatory behavior, evidence of impaired self-control, severe dysphoria/symptomatology, multiple risk factors present, and few if any protective factors, particularly a lack of social support.  PLAN OF CARE: See H&P.  I certify that inpatient services furnished can reasonably be expected to improve the patient's condition.    Armandina Stammer, NP, pmhnp, fnp-bc 08/25/2023, 1:00 PM

## 2023-08-25 NOTE — Group Note (Signed)
LCSW Group Therapy Note   Group Date: 08/25/2023 Start Time: 1100 End Time: 1200  Type of Therapy and Topic: Group Therapy: Relationship Check-Ins   Participation Level: Active   Description Group:  In this group patients are encouraged to rate how well or not well they are able to improve their relationships in Beliefs and Values, Communication, Family and Friends, Actuary and Household, and Intimacy. Patients will be able to discuss and identify what is going well within these aspects and what is not. Patients will be able to find appropriate ways, solutions, and skills that will help them within the selected relationships category. Patients will be encouraged to share and reflect on why things within their relationships are not going so well and get feedback from the instructor or their peers.  This group will be solution focused and process-oriented with patients' participation in sharing and listening to their own and peers experience; along with receive support and advice on how to improve or change the circumstance that is known to be challenging in their relationships category (Beliefs and Values, Communication, Family and Friends, Actuary and Household, and Intimacy).   Therapeutic Goals:  Patient will identify their strengths and weakness within their Beliefs and Values, Communication, Family and Friends, Actuary and Household, and Intimacy relationships. Patient will identify reasons why and how they can improve their relationships.  Patient will identify how they can be supportive and honest to themselves and in their relationships.  Patient will be able to gain support and give support to others with similar challenges.   Summary of Patient Progress Pt was in group and active.    Therapeutic Modalities:  Solution Focused Therapy Cognitive Behavioral Therapy  Psychodynamic Therapy  Dialectical Behavior Therapy   Jacob Moores , LCSWA  Izell [place], LCSW 08/25/2023   12:15 PM

## 2023-08-25 NOTE — Group Note (Signed)
Date:  08/25/2023 Time:  9:22 PM  Group Topic/Focus:  Wrap-Up Group:   The focus of this group is to help patients review their daily goal of treatment and discuss progress on daily workbooks.    Participation Level:  Active  Participation Quality:  Appropriate and Sharing  Affect:  Appropriate  Cognitive:  Appropriate  Insight: Appropriate  Engagement in Group:  Engaged  Modes of Intervention:  Discussion and Socialization  Additional Comments:  The group stated off with a question; what is your favorite sport/activity. The patient stated that she enjoys swimming and it beng a way she relieves stress. The patient stated that her day started off bad but improved as the day went one. The patient stated that her goal was to attend more groups and interact with more with others on the unit.   Destiny Simmons 08/25/2023, 9:22 PM

## 2023-08-25 NOTE — Plan of Care (Signed)
  Problem: Education: Goal: Knowledge of [facility] Education information/materials will improve Outcome: Progressing   Problem: Education: Goal: Emotional status will improve Outcome: Progressing   Problem: Education: Goal: Mental status will improve Outcome: Progressing   Problem: Activity: Goal: Sleeping patterns will improve Outcome: Progressing   Problem: Safety: Goal: Periods of time without injury will increase Outcome: Progressing
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
## 2023-08-25 NOTE — H&P (Signed)
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 Ability to maintain clinical measurements within normal limits will improve, Compliance with prescribed medications will improve, and Ability to identify triggers associated with substance abuse/mental health issues will improve  I certify that inpatient services furnished can reasonably be expected to improve the patient's condition.    Armandina Stammer, NP, pmhnp, fnp-bc 8/29/20242:15 PM

## 2023-08-25 NOTE — [facility] Group Notes (Signed)
Adult Psychoeducational Group Note  Date:  08/25/2023 Time:  10:02 AM  Group Topic/Focus:  Goals Group:   The focus of this group is to help patients establish daily goals to achieve during treatment and discuss how the patient can incorporate goal setting into their daily lives to aide in recovery.  Participation Level:  Active  Participation Quality:  Attentive  Affect:  Appropriate  Cognitive:  Appropriate  Insight: Appropriate  Engagement in Group:  Engaged  Modes of Intervention:  Discussion and Education  Additional Comments:  Pt participated in group today. Pt stated her goal is to use participate in groups Pt became emotional when sharing about family and them judging her over her multiple time of being the hospital. Pt was open to suggestions and feedback.  Rhena Glace 08/25/2023, 10:02 AM

## 2023-08-25 NOTE — Progress Notes (Signed)
Patient rated her anxiety level 9/10 and her depression level 9/10 with 10 being the highest and 0 none. Patient stated her goal for today as, "Self worth". Medication and group compliant. Pt observed interacting well with some peers. Pt endorsed passive SI this AM and denied same later in shift. Pt verbally agreed not to harm herself. Patient denied HI and AVH.  Pt tolerated meals well on shift. Safety maintained.  08/25/23 0810  Psych Admission Type (Psych Patients Only)  Admission Status Voluntary  Psychosocial Assessment  Patient Complaints Anxiety;Depression;Helplessness  Eye Contact Fair  Facial Expression Anxious;Sad  Affect Anxious;Depressed;Sad  Speech Logical/coherent  Interaction Assertive  Motor Activity Other (Comment) (WNL)  Appearance/Hygiene In scrubs  Behavior Characteristics Anxious;Cooperative  Mood Depressed;Anxious  Thought Process  Coherency WDL  Content WDL  Delusions None reported or observed  Perception WDL  Hallucination None reported or observed  Judgment Poor  Confusion None  Danger to Self  Current suicidal ideation? Passive  Agreement Not to Harm Self Yes  Description of Agreement Verbal  Danger to Others  Danger to Others None reported or observed

## 2023-08-25 NOTE — Progress Notes (Signed)
   08/25/23 0615  15 Minute Checks  Location Bedroom  Visual Appearance Calm  Behavior Sleeping  Sleep (Behavioral Health Patients Only)  Calculate sleep? (Click Yes once per 24 hr at 0600 safety check) Yes  Documented sleep last 24 hours 8.25

## 2023-08-25 NOTE — Progress Notes (Signed)
   08/24/23 2140  Psych Admission Type (Psych Patients Only)  Admission Status Voluntary  Psychosocial Assessment  Patient Complaints Anxiety;Depression;Hopelessness;Insomnia  Eye Contact Fair  Facial Expression Anxious;Sad  Affect Anxious;Depressed;Sad  Speech Logical/coherent  Interaction Assertive  Motor Activity Slow  Appearance/Hygiene In scrubs  Behavior Characteristics Cooperative;Appropriate to situation  Mood Depressed;Anxious  Thought Process  Coherency WDL  Content WDL  Delusions None reported or observed  Perception WDL  Hallucination None reported or observed  Judgment Poor  Confusion None  Danger to Self  Current suicidal ideation? Denies  Agreement Not to Harm Self Yes  Description of Agreement Verbal  Danger to Others  Danger to Others None reported or observed

## 2023-08-25 NOTE — Plan of Care (Signed)
  Problem: Education: Goal: Knowledge of [facility] Education information/materials will improve Outcome: Progressing   Problem: Education: Goal: Emotional status will improve Outcome: Progressing   Problem: Activity: Goal: Interest or engagement in activities will improve Outcome: Progressing   Problem: Activity: Goal: Sleeping patterns will improve Outcome: Progressing   Problem: Safety: Goal: Periods of time without injury will increase Outcome: Progressing

## 2023-08-26 ENCOUNTER — Encounter (HOSPITAL_COMMUNITY): Payer: Self-pay

## 2023-08-26 DIAGNOSIS — F332 Major depressive disorder, recurrent severe without psychotic features: Secondary | ICD-10-CM | POA: Diagnosis not present

## 2023-08-26 LAB — HEMOGLOBIN A1C
Hgb A1c MFr Bld: 5.8 % — ABNORMAL HIGH (ref 4.8–5.6)
Mean Plasma Glucose: 119.76 mg/dL

## 2023-08-26 LAB — LIPID PANEL
Cholesterol: 155 mg/dL (ref 0–200)
HDL: 47 mg/dL (ref >40)
LDL Cholesterol: 69 mg/dL (ref 0–99)
Total CHOL/HDL Ratio: 3.3 RATIO
Triglycerides: 195 mg/dL — ABNORMAL HIGH (ref <150)
VLDL: 39 mg/dL (ref 0–40)

## 2023-08-26 LAB — URINALYSIS, ROUTINE W REFLEX MICROSCOPIC
Bilirubin Urine: NEGATIVE
Glucose, UA: NEGATIVE mg/dL
Hgb urine dipstick: NEGATIVE
Ketones, ur: NEGATIVE mg/dL
Leukocytes,Ua: NEGATIVE
Nitrite: NEGATIVE
Protein, ur: NEGATIVE mg/dL
Specific Gravity, Urine: 1.015 (ref 1.005–1.030)
pH: 5 (ref 5.0–8.0)

## 2023-08-26 LAB — TSH: TSH: 4.395 u[IU]/mL (ref 0.350–4.500)

## 2023-08-26 MED ORDER — NICOTINE POLACRILEX 2 MG MT GUM
2.0000 mg | CHEWING_GUM | OROMUCOSAL | Status: DC | PRN
  Administered 2023-08-26: 2 mg via ORAL
  Filled 2023-08-26 (×2): qty 1

## 2023-08-26 MED ORDER — DIPHENHYDRAMINE HCL 50 MG PO CAPS
50.0000 mg | ORAL_CAPSULE | Freq: Every day | ORAL | Status: DC
  Administered 2023-08-26 – 2023-08-28 (×3): 50 mg via ORAL
  Filled 2023-08-26 (×2): qty 1
  Filled 2023-08-26: qty 2
  Filled 2023-08-26 (×2): qty 1

## 2023-08-26 MED ORDER — WHITE PETROLATUM EX OINT
TOPICAL_OINTMENT | CUTANEOUS | Status: AC
  Filled 2023-08-26: qty 5

## 2023-08-26 NOTE — Progress Notes (Signed)
   08/26/23 0556  15 Minute Checks  Location Bedroom  Visual Appearance Calm  Behavior Composed  Sleep (Behavioral Health Patients Only)  Calculate sleep? (Click Yes once per 24 hr at 0600 safety check) Yes  Documented sleep last 24 hours 7.75

## 2023-08-26 NOTE — Group Note (Signed)
Recreation Therapy Group Note   Group Topic:Team Building  Group Date: 08/26/2023 Start Time: 0930 End Time: 1000 Facilitators: Mumtaz Lovins-McCall, LRT,CTRS Location: [address] Dayroom   Goal Area(s) Addresses:  Patient will effectively work with peer towards shared goal.  Patient will identify skills used to make activity successful.  Patient will identify how skills used during activity can be applied to reach post d/c goals.   Group Description: [employer]. In teams of 5-6, patients were given 11 craft pipe cleaners. Using the materials provided, patients were instructed to compete again the opposing team(s) to build the tallest free-standing structure from floor level. The activity was timed; difficulty increased by Clinical research associate as Production designer, theatre/television/film continued.  Systematically resources were removed with additional directions for example, placing one arm behind their back, working in silence, and shape stipulations. LRT facilitated post-activity discussion reviewing team processes and necessary communication skills involved in completion. Patients were encouraged to reflect how the skills utilized, or not utilized, in this activity can be incorporated to positively impact support systems post discharge.   Clinical Observations/Individualized Feedback: Group did not occur due to previous group going over into group time.    Plan: Continue to engage patient in RT group sessions 2-3x/week.   Michaelpaul Apo-McCall, LRT,CTRS 08/26/2023 11:45 AM

## 2023-08-26 NOTE — Progress Notes (Signed)
   08/26/23 0857  Psych Admission Type (Psych Patients Only)  Admission Status Voluntary  Psychosocial Assessment  Patient Complaints Depression  Eye Contact Fair  Facial Expression Animated  Affect Appropriate to circumstance  Speech Logical/coherent  Interaction Assertive  Motor Activity Other (Comment) (WDL)  Appearance/Hygiene Unremarkable  Behavior Characteristics Cooperative  Mood Depressed  Thought Process  Coherency WDL  Content WDL  Delusions None reported or observed  Perception WDL  Hallucination None reported or observed  Judgment Poor  Confusion None  Danger to Self  Current suicidal ideation? Denies  Agreement Not to Harm Self Yes  Description of Agreement verbal  Danger to Others  Danger to Others None reported or observed

## 2023-08-26 NOTE — BH IP Treatment Plan (Signed)
Interdisciplinary Treatment and Diagnostic Plan Update  08/26/2023 Time of Session: 10:20 AM  Destiny Simmons MRN: [id number]  Principal Diagnosis: Major depressive disorder, recurrent episode, severe (HCC)  Secondary Diagnoses: Principal Problem:   Major depressive disorder, recurrent episode, severe (HCC)   Current Medications:  Current Facility-Administered Medications  Medication Dose Route Frequency Provider Last Rate Last Admin   albuterol (VENTOLIN HFA) 108 (90 Base) MCG/ACT inhaler 1-2 puff  1-2 puff Inhalation Q6H PRN Rex Kras, MD       alum & mag hydroxide-simeth (MAALOX/MYLANTA) 200-200-20 MG/5ML suspension 30 mL  30 mL Oral Q4H PRN Bennett, Christal H, NP       atorvastatin (LIPITOR) tablet 40 mg  40 mg Oral Daily Rex Kras, MD   40 mg at 08/26/23 0857   brexpiprazole (REXULTI) tablet 2 mg  2 mg Oral Daily Armandina Stammer I, NP   2 mg at 08/26/23 4098   celecoxib (CELEBREX) capsule 100 mg  100 mg Oral Daily Rex Kras, MD   100 mg at 08/26/23 0857   diphenhydrAMINE (BENADRYL) capsule 50 mg  50 mg Oral TID PRN Bennett, Christal H, NP       Or   diphenhydrAMINE (BENADRYL) injection 50 mg  50 mg Intramuscular TID PRN Bennett, Christal H, NP       FLUoxetine (PROZAC) capsule 20 mg  20 mg Oral Daily Nwoko, Nicole Kindred I, NP   20 mg at 08/26/23 0857   haloperidol (HALDOL) tablet 5 mg  5 mg Oral TID PRN Bennett, Christal H, NP       Or   haloperidol lactate (HALDOL) injection 5 mg  5 mg Intramuscular TID PRN Bennett, Christal H, NP       hydrochlorothiazide (HYDRODIURIL) tablet 12.5 mg  12.5 mg Oral q morning Rex Kras, MD   12.5 mg at 08/26/23 0945   hydrOXYzine (ATARAX) tablet 50 mg  50 mg Oral TID PRN Willeen Cass, Christal H, NP   50 mg at 08/25/23 0729   ibuprofen (ADVIL) tablet 600 mg  600 mg Oral Q6H PRN Armandina Stammer I, NP   600 mg at 08/26/23 0543   LORazepam (ATIVAN) tablet 2 mg  2 mg Oral TID PRN Bennett, Christal H, NP       Or   LORazepam (ATIVAN)  injection 2 mg  2 mg Intramuscular TID PRN Bennett, Christal H, NP       magnesium hydroxide (MILK OF MAGNESIA) suspension 30 mL  30 mL Oral Daily PRN Bennett, Christal H, NP       nicotine (NICODERM CQ - dosed in mg/24 hours) patch 21 mg  21 mg Transdermal Daily Massengill, Harrold Donath, MD   21 mg at 08/26/23 0857   ondansetron (ZOFRAN) tablet 4 mg  4 mg Oral Q8H PRN Armandina Stammer I, NP       ramelteon (ROZEREM) tablet 8 mg  8 mg Oral QHS Nwoko, Nicole Kindred I, NP   8 mg at 08/25/23 2138   PTA Medications: Medications Prior to Admission  Medication Sig Dispense Refill Last Dose   albuterol (PROVENTIL HFA) 108 (90 Base) MCG/ACT inhaler Inhale 1-2 puffs into the lungs every 6 (six) hours as needed for wheezing or shortness of breath.      atorvastatin (LIPITOR) 40 MG tablet Take 40 mg by mouth daily.      celecoxib (CELEBREX) 100 MG capsule Take 100 mg by mouth daily.      gabapentin (NEURONTIN) 100 MG capsule Take 100 mg by mouth 3 (three) times daily. (  Patient not taking: Reported on 08/24/2023)      hydrochlorothiazide (HYDRODIURIL) 12.5 MG tablet Take 12.5 mg by mouth every morning.      hydrOXYzine (ATARAX) 25 MG tablet Take 25 mg by mouth at bedtime as needed.      ondansetron (ZOFRAN ODT) 4 MG disintegrating tablet Take 1 tablet (4 mg total) by mouth every 8 (eight) hours as needed for nausea or vomiting. (Patient not taking: Reported on 08/24/2023) 20 tablet 0    PARoxetine (PAXIL) 20 MG tablet Take 20 mg by mouth daily.      REXULTI 1 MG TABS tablet Take 1 mg by mouth daily.      zolpidem (AMBIEN) 10 MG tablet Take 10 mg by mouth at bedtime as needed for sleep.       Patient Stressors: Loss of father   Substance abuse    Patient Strengths: Manufacturing systems engineer  Supportive family/friends   Treatment Modalities: Medication Management, Group therapy, Case management,  1 to 1 session with clinician, Psychoeducation, Recreational therapy.   Physician Treatment Plan for Primary Diagnosis: Major  depressive disorder, recurrent episode, severe (HCC) Long Term Goal(s): Improvement in symptoms so as ready for discharge   Short Term Goals: Ability to identify and develop effective coping behaviors will improve Ability to maintain clinical measurements within normal limits will improve Compliance with prescribed medications will improve Ability to identify triggers associated with substance abuse/mental health issues will improve Ability to identify changes in lifestyle to reduce recurrence of condition will improve Ability to verbalize feelings will improve Ability to disclose and discuss suicidal ideas Ability to demonstrate self-control will improve  Medication Management: Evaluate patient's response, side effects, and tolerance of medication regimen.  Therapeutic Interventions: 1 to 1 sessions, Unit Group sessions and Medication administration.  Evaluation of Outcomes: Not Progressing  Physician Treatment Plan for Secondary Diagnosis: Principal Problem:   Major depressive disorder, recurrent episode, severe (HCC)  Long Term Goal(s): Improvement in symptoms so as ready for discharge   Short Term Goals: Ability to identify and develop effective coping behaviors will improve Ability to maintain clinical measurements within normal limits will improve Compliance with prescribed medications will improve Ability to identify triggers associated with substance abuse/mental health issues will improve Ability to identify changes in lifestyle to reduce recurrence of condition will improve Ability to verbalize feelings will improve Ability to disclose and discuss suicidal ideas Ability to demonstrate self-control will improve     Medication Management: Evaluate patient's response, side effects, and tolerance of medication regimen.  Therapeutic Interventions: 1 to 1 sessions, Unit Group sessions and Medication administration.  Evaluation of Outcomes: Not Progressing   RN Treatment  Plan for Primary Diagnosis: Major depressive disorder, recurrent episode, severe (HCC) Long Term Goal(s): Knowledge of disease and therapeutic regimen to maintain health will improve  Short Term Goals: Ability to remain free from injury will improve, Ability to verbalize frustration and anger appropriately will improve, Ability to demonstrate self-control, Ability to participate in decision making will improve, Ability to verbalize feelings will improve, Ability to disclose and discuss suicidal ideas, Ability to identify and develop effective coping behaviors will improve, and Compliance with prescribed medications will improve  Medication Management: RN will administer medications as ordered by provider, will assess and evaluate patient's response and provide education to patient for prescribed medication. RN will report any adverse and/or side effects to prescribing provider.  Therapeutic Interventions: 1 on 1 counseling sessions, Psychoeducation, Medication administration, Evaluate responses to treatment, Monitor vital signs and CBGs  as ordered, Perform/monitor CIWA, COWS, AIMS and Fall Risk screenings as ordered, Perform wound care treatments as ordered.  Evaluation of Outcomes: Not Progressing   LCSW Treatment Plan for Primary Diagnosis: Major depressive disorder, recurrent episode, severe (HCC) Long Term Goal(s): Safe transition to appropriate next level of care at discharge, Engage patient in therapeutic group addressing interpersonal concerns.  Short Term Goals: Engage patient in aftercare planning with referrals and resources, Increase social support, Increase ability to appropriately verbalize feelings, Increase emotional regulation, Facilitate acceptance of mental health diagnosis and concerns, Facilitate patient progression through stages of change regarding substance use diagnoses and concerns, Identify triggers associated with mental health/substance abuse issues, and Increase skills for  wellness and recovery  Therapeutic Interventions: Assess for all discharge needs, 1 to 1 time with Social worker, Explore available resources and support systems, Assess for adequacy in community support network, Educate family and significant other(s) on suicide prevention, Complete Psychosocial Assessment, Interpersonal group therapy.  Evaluation of Outcomes: Not Progressing   Progress in Treatment: Attending groups: Yes. Participating in groups: Yes. Taking medication as prescribed: Yes. Toleration medication: Yes. Family/Significant other contact made: Yes, individual(s) contacted:  Roksana Kilian (husband) [phone] - husband Patient understands diagnosis: Yes. Discussing patient identified problems/goals with staff: Yes. Medical problems stabilized or resolved: Yes. Denies suicidal/homicidal ideation: Yes. Issues/concerns per patient self-inventory: No.   New problem(s) identified: No, Describe:  None reported   New Short Term/Long Term Goal(s): medication stabilization, elimination of SI thoughts, development of comprehensive mental wellness plan.    Patient Goals:  " My main goal is to not be suicidal and my second goal is to find the right medications that I need to be on "   Discharge Plan or Barriers: Patient recently admitted. CSW will continue to follow and assess for appropriate referrals and possible discharge planning.    Reason for Continuation of Hospitalization: Anxiety Depression Medication stabilization Suicidal ideation  Estimated Length of Stay: 3-5 days  Last 3 [place] Suicide Severity Risk Score: Flowsheet Row Ad[facility] (Current[facility] Most recent reading at 08/26/2023 12:00 AM ED from 08/24/2023 in [facility] at [facility] Most recent reading at 08/24/2023  9:29 AM ED from 11/19/2022 in [facility] Emergency Department at [facility] Most recent reading at  11/19/2022  3:59 PM  C-SSRS RISK CATEGORY No Risk High Risk No Risk       Last PHQ 2/9 Scores:     No data to display          Scribe for Treatment Team: Beather Arbour 08/26/2023 12:45 PM

## 2023-08-26 NOTE — [facility] Group Notes (Signed)
Adult Psychoeducational Group Note  Date:  08/26/2023 Time:  10:56 AM  Group Topic/Focus:  Goals Group:   The focus of this group is to help patients establish daily goals to achieve during treatment and discuss how the patient can incorporate goal setting into their daily lives to aide in recovery.  Participation Level:  Did Not Attend  Participation Quality:    Affect:    Cognitive:    Insight:   Engagement in Group:    Modes of Intervention:    Additional Comments:  Pt did not attend group  [employer] 08/26/2023, 10:56 AM

## 2023-08-26 NOTE — [facility] Group Notes (Signed)
[facility] Group Notes:  (Nursing/MHT/Case Management/Adjunct)  Date:  08/26/2023  Time:  9:27 PM  Type of Therapy:   AA Meeting  Participation Level:  Active  Participation Quality:  Appropriate  Affect:  Appropriate  Cognitive:  Appropriate  Insight:  Appropriate  Engagement in Group:  Developing/Improving and Supportive  Modes of Intervention:  Discussion, Socialization, and Support  Summary of Progress/Problems: Pt attended AA meeting  [place] Lewis 08/26/2023, 9:27 PM

## 2023-08-26 NOTE — Progress Notes (Signed)
[facility] MD Progress Note  08/26/2023 3:55 PM Destiny Simmons  MRN:  [id number]  Reason for admission: 52 year old Caucasian female with prior hx mental health issues that include (bipolar disorder, depressed-type, anxiety disorder, PTSD & polysubstance use disorders. Admitted to the [facility] from the [facility] with complain of worsening of depression & suicidal ideations of two days, triggered by the recent death of father 05/18/23) & daughter's recent accidental overdose on fentanyl two weeks ago. Patient was apparently taken to the [facility] by her husband. After medical evaluation/clearance, Destiny Simmons was transferred to the [facility] for further psychiatric evaluation/clearance. A review of her toxicology reports has shown her UDS positive for amphetamine, cocaine & THC.   Daily notes: Destiny Simmons is seen, chart reviewed. The chart findings discussed with the treatment team. She presents alert, oriented & aware of situation. She is visible on the unit, attending group sessions. She reports, "I'm feeling a lot better today than I have felt in a long time. I feel hopeful. Prior to coming to the hospital, it was a daily routine for me to wake up every morning & wishing I do not want to be alive any more. I slept well alt night. I got about 7.5 hours. The new sleep medicine worked well for me last night. I did attend group sessions yesterday & this morning already. I'm doing well on the medicines without any side effects". Destiny Simmons currently denies any SIHI, AVH, delusional thoughts or paranoia. She does not appear to be responding to any internal stimuli. There are currently no changes made on her current plan of care. Will continue as already in progress. Reviewed vital signs, stable. Reviewed current lab results, Trigl. 195 (H),  Hgba1c. 5.8 (H), TSH 4.395, wnl. There are no behavioral issues reported by staff. Patient continues on her current plan of care as noted below.  Principal Problem: Major depressive disorder, recurrent episode,  severe (HCC)  Diagnosis: Principal Problem:   Major depressive disorder, recurrent episode, severe (HCC)  Total Time spent with patient: 45 minutes  Past Psychiatric History:  Patient reports hx of bipolar disorder, major depressive disorder, PTSD & anxiety disorder. Also reports stimulant use disorder (crack cocaine), benzodiazepine use disorder & cannabis use disorder. Patient reports was in a treatment center in [place] a long time ago. Reports has been hospitalized at the [facility] in the past. Reports several suicide attempts & was on life support twice as a result of her suicide attempts. Says has tried multiple psychotropic medication that include; Effexor, Abilify, Sertraline Cymbalta & Wellbutrin.   Past Medical History:  Past Medical History:  Diagnosis Date   ADHD (attention deficit hyperactivity disorder)    Anxiety    Arthritis    Asthma    Bipolar disorder (HCC)    COPD (chronic obstructive pulmonary disease) (HCC)    Depression    History of borderline personality disorder    Malrotation of intestine    PTSD (post-traumatic stress disorder)    Sleep apnea    Thyroid disease 10/11/2017    Past Surgical History:  Procedure Laterality Date   ABDOMINAL HYSTERECTOMY     ABDOMINAL SURGERY     COLONOSCOPY W/ BIOPSIES  04/02/2015   2 sessile polyps sigmoid colon   ESOPHAGOGASTRODUODENOSCOPY ENDOSCOPY  04/02/2015   localized mildly erythomatous mucosa w/o active bleeding found in duodenal buld   Family History:  Family History  Problem Relation Age of Onset   Diabetes Father    Alcohol abuse Father    Anxiety  disorder Father    Depression Father    Cancer Paternal Aunt        unknown cancer   Cancer Paternal Uncle        unknown cancer   Heart disease Paternal Grandfather    OCD Sister    Anxiety disorder Sister    Depression Sister    Bipolar disorder Sister    Diabetes Sister    ADD / ADHD Daughter    Bipolar disorder Daughter    ADD / ADHD Grandchild     Anxiety disorder Son    Depression Son    Family Psychiatric  History: See H&P.  Social History:  Social History   Substance and Sexual Activity  Alcohol Use No     Social History   Substance and Sexual Activity  Drug Use Yes   Types: Marijuana, Benzodiazepines    Social History   Socioeconomic History   Marital status: Married    Spouse name: Not on file   Number of children: Not on file   Years of education: Not on file   Highest education level: Not on file  Occupational History   Not on file  Tobacco Use   Smoking status: Every Day    Current packs/day: 1.00    Types: Cigarettes   Smokeless tobacco: Never  Vaping Use   Vaping status: Never Used  Substance and Sexual Activity   Alcohol use: No   Drug use: Yes    Types: Marijuana, Benzodiazepines   Sexual activity: Never    Birth control/protection: None  Other Topics Concern   Not on file  Social History Narrative   Not on file   Social Determinants of Health   Financial Resource Strain: Not on file  Food Insecurity: No Food Insecurity (08/24/2023)   Hunger Vital Sign    Worried About Running Out of Food in the Last Year: Never true    Ran Out of Food in the Last Year: Never true  Transportation Needs: No Transportation Needs (08/24/2023)   PRAPARE - Administrator, Civil Service (Medical): No    Lack of Transportation (Non-Medical): No  Physical Activity: Not on file  Stress: Not on file  Social Connections: Not on file   Additional Social History:   Sleep: Good  Appetite:  Good  Current Medications: Current Facility-Administered Medications  Medication Dose Route Frequency Provider Last Rate Last Admin   albuterol (VENTOLIN HFA) 108 (90 Base) MCG/ACT inhaler 1-2 puff  1-2 puff Inhalation Q6H PRN Rex Kras, MD       alum & mag hydroxide-simeth (MAALOX/MYLANTA) 200-200-20 MG/5ML suspension 30 mL  30 mL Oral Q4H PRN Bennett, Christal H, NP       atorvastatin (LIPITOR) tablet 40  mg  40 mg Oral Daily Rex Kras, MD   40 mg at 08/26/23 0857   brexpiprazole (REXULTI) tablet 2 mg  2 mg Oral Daily Armandina Stammer I, NP   2 mg at 08/26/23 0857   celecoxib (CELEBREX) capsule 100 mg  100 mg Oral Daily Rex Kras, MD   100 mg at 08/26/23 0857   diphenhydrAMINE (BENADRYL) capsule 50 mg  50 mg Oral TID PRN Bennett, Christal H, NP       Or   diphenhydrAMINE (BENADRYL) injection 50 mg  50 mg Intramuscular TID PRN Bennett, Christal H, NP       FLUoxetine (PROZAC) capsule 20 mg  20 mg Oral Daily Armandina Stammer I, NP   20 mg at 08/26/23 [phone]  haloperidol (HALDOL) tablet 5 mg  5 mg Oral TID PRN Bennett, Christal H, NP       Or   haloperidol lactate (HALDOL) injection 5 mg  5 mg Intramuscular TID PRN Bennett, Christal H, NP       hydrochlorothiazide (HYDRODIURIL) tablet 12.5 mg  12.5 mg Oral q morning Rex Kras, MD   12.5 mg at 08/26/23 0945   hydrOXYzine (ATARAX) tablet 50 mg  50 mg Oral TID PRN Willeen Cass, Christal H, NP   50 mg at 08/25/23 0729   ibuprofen (ADVIL) tablet 600 mg  600 mg Oral Q6H PRN Armandina Stammer I, NP   600 mg at 08/26/23 0543   LORazepam (ATIVAN) tablet 2 mg  2 mg Oral TID PRN Bennett, Christal H, NP       Or   LORazepam (ATIVAN) injection 2 mg  2 mg Intramuscular TID PRN Bennett, Christal H, NP       magnesium hydroxide (MILK OF MAGNESIA) suspension 30 mL  30 mL Oral Daily PRN Bennett, Christal H, NP       nicotine polacrilex (NICORETTE) gum 2 mg  2 mg Oral PRN Massengill, Harrold Donath, MD   2 mg at 08/26/23 1507   ondansetron (ZOFRAN) tablet 4 mg  4 mg Oral Q8H PRN Armandina Stammer I, NP       ramelteon (ROZEREM) tablet 8 mg  8 mg Oral QHS Armandina Stammer I, NP   8 mg at 08/25/23 2138    Lab Results:  Results for orders placed or performed during the hospital encounter of 08/24/23 (from the past 48 hour(s))  Urinalysis, Routine w reflex microscopic -Urine, Clean Catch     Status: Abnormal   Collection Time: 08/25/23  7:21 AM  Result Value Ref Range   Color,  Urine STRAW (A) YELLOW   APPearance CLEAR CLEAR   Specific Gravity, Urine 1.015 1.005 - 1.030   pH 5.0 5.0 - 8.0   Glucose, UA NEGATIVE NEGATIVE mg/dL   Hgb urine dipstick NEGATIVE NEGATIVE   Bilirubin Urine NEGATIVE NEGATIVE   Ketones, ur NEGATIVE NEGATIVE mg/dL   Protein, ur NEGATIVE NEGATIVE mg/dL   Nitrite NEGATIVE NEGATIVE   Leukocytes,Ua NEGATIVE NEGATIVE    Comment: Performed at [facility], [address] [place], [place] [address]  Lipid panel     Status: Abnormal   Collection Time: 08/26/23  6:29 AM  Result Value Ref Range   Cholesterol 155 0 - 200 mg/dL   Triglycerides 191 (H) <150 mg/dL   HDL 47 >47 mg/dL   Total CHOL/HDL Ratio 3.3 RATIO   VLDL 39 0 - 40 mg/dL   LDL Cholesterol 69 0 - 99 mg/dL    Comment:        Total Cholesterol/HDL:CHD Risk Coronary Heart Disease Risk Table                     Men   Women  1/2 Average Risk   3.4   3.3  Average Risk       5.0   4.4  2 X Average Risk   9.6   7.1  3 X Average Risk  23.4   11.0        Use the calculated Patient Ratio above and the CHD Risk Table to determine the patient's CHD Risk.        ATP III CLASSIFICATION (LDL):  <100     mg/dL   Optimal  829-562  mg/dL   Near or Above  Optimal  130-159  mg/dL   Borderline  782-956  mg/dL   High  >213     mg/dL   Very High Performed at [facility], [address], [place], [place] [address]   TSH     Status: None   Collection Time: 08/26/23  6:29 AM  Result Value Ref Range   TSH 4.395 0.350 - 4.500 uIU/mL    Comment: Performed by a 3rd Generation assay with a functional sensitivity of <=0.01 uIU/mL. Performed at [facility], [address], [place], [place] [address]   Hemoglobin A1c     Status: Abnormal   Collection Time: 08/26/23  6:29 AM  Result Value Ref Range   Hgb A1c MFr Bld 5.8 (H) 4.8 - 5.6 %    Comment: (NOTE) Pre diabetes:          5.7%-6.4%  Diabetes:               >6.4%  Glycemic control for   <7.0% adults with diabetes    Mean Plasma Glucose 119.76 mg/dL    Comment: Performed at [facility], [address], [place], [place] [address]    Blood Alcohol level:  Lab Results  Component Value Date   [facility] <10 08/24/2023   ETH <10 03/10/2018    Metabolic Disorder Labs: Lab Results  Component Value Date   HGBA1C 5.8 (H) 08/26/2023   MPG 119.76 08/26/2023   No results found for: "PROLACTIN" Lab Results  Component Value Date   CHOL 155 08/26/2023   TRIG 195 (H) 08/26/2023   HDL 47 08/26/2023   CHOLHDL 3.3 08/26/2023   VLDL 39 08/26/2023   LDLCALC 69 08/26/2023    Physical Findings: AIMS:  , ,  ,  ,    CIWA:    COWS:     Musculoskeletal: Strength & Muscle Tone: within normal limits Gait & Station: normal Patient leans: N/A  Psychiatric Specialty Exam:  Presentation  General Appearance:  Casual; Fairly Groomed  Eye Contact: Good  Speech: Clear and Coherent; Normal Rate  Speech Volume: Normal  Handedness:Right   Mood and Affect  Mood: -- (Hopeful)  Affect: Congruent   Thought Process  Thought Processes: Coherent; Goal Directed; Linear  Descriptions of Associations:Intact  Orientation:Full (Time, Place and Person)  Thought Content:Logical  History of Schizophrenia/Schizoaffective disorder:No  Duration of Psychotic Symptoms:No data recorded Hallucinations:Hallucinations: None  Ideas of Reference:None  Suicidal Thoughts:Suicidal Thoughts: No SI Active Intent and/or Plan: Without Intent; Without Plan; Without Means to Carry Out; Without Access to Means  Homicidal Thoughts:Homicidal Thoughts: No   Sensorium  Memory: Immediate Good; Recent Good; Remote Good  Judgment: Fair  Insight: Fair   Art therapist  Concentration: Good  Attention Span: Good  Recall: Good  Fund of Knowledge: Fair  Language:Good   Psychomotor Activity  Psychomotor Activity:Psychomotor Activity:  Normal   Assets  Assets: Manufacturing systems engineer; Desire for Improvement; Financial Resources/Insurance; Housing; Physical Health; Resilience; Social Support   Sleep  Sleep:Sleep: Good Number of Hours of Sleep: 7.5    Physical Exam: Physical Exam Vitals and nursing note reviewed.  HENT:     Nose: Nose normal.     Mouth/Throat:     Pharynx: Oropharynx is clear.  Cardiovascular:     Rate and Rhythm: Normal rate.     Pulses: Normal pulses.  Pulmonary:     Effort: Pulmonary effort is normal.  Genitourinary:    Comments: Deferred Musculoskeletal:  General: Normal range of motion.     Cervical back: Normal range of motion.  Skin:    General: Skin is warm and dry.  Neurological:     General: No focal deficit present.     Mental Status: She is alert and oriented to person, place, and time.    Review of Systems  Constitutional:  Negative for chills, diaphoresis and fever.  HENT:  Negative for congestion and sore throat.   Respiratory:  Negative for cough, shortness of breath and wheezing.   Cardiovascular:  Negative for chest pain and palpitations.  Gastrointestinal:  Negative for abdominal pain, constipation, diarrhea, heartburn, nausea and vomiting.  Genitourinary:  Negative for dysuria.  Musculoskeletal:  Negative for joint pain and myalgias.  Neurological:  Negative for dizziness, tingling, tremors, sensory change, speech change, focal weakness, seizures, loss of consciousness, weakness and headaches.  Psychiatric/Behavioral:  Positive for depression and substance abuse (Hx co use disorder). Negative for hallucinations, memory loss and suicidal ideas (Hx of). The patient is not nervous/anxious and does not have insomnia.    Blood pressure 111/82, pulse 85, temperature 97.7 F (36.5 C), temperature source Oral, resp. rate 16, height 5\' 2"  (1.575 m), weight 99.8 kg, SpO2 98%. Body mass index is 40.24 kg/m.  Treatment Plan Summary: Daily contact with patient to assess  and evaluate symptoms and progress in treatment and Medication management.   Continue inpatient hospitalization.  Will continue today 08/26/2023 plan as below except where it is noted.   Principal/active diagnoses. Major depressive disorder, recurrent episode, severe (HCC. Stimulant use disorder. Hx. Benzodiazepine use disorder.  Associated symptoms.  Suicidal ideations.  Insomnia.  Plan: The risks/benefits/side-effects/alternatives to the medications in use were discussed in detail with the patient and time was given for patient's questions. The patient consents to medication trial.    -Continue Rexulti 2 mg po daily for mood control (home med).  -Discontinued Paxil 20 mg (ineffective per pt's report).  -Continue Fluoxetine 20 mg po daily (to start 08-26-23). -Continue Hydroxyzine 50 mg po tid prn for anxiety.  -Continue Rozerem 8 mg po Q hs for insomnia.  -Continue Benadryl 50 mg po at bedtime to augment Rozerem.  -Continue Nicotine patch 212 mg topically Q 24 hours fpr nicotine withdrawal.    Agitation protocols: Cont as recommended;  -Benadryl 50 mg po or IM tid prn. -Haldol 5 mg po or IM tid prn.  -Lorazepam 2 mg po or IM tid prn.   Labs to be obtained: TSH, lipid panel, hgba1c, U/A.   Medical issues being addressed.  Resumed:  -Continue Albuterol inhaler 1-2 puff Q 6 hours prn for SOB. -Continue Atorvastatin 40 mg po daily for hyperlipidemia.  -Continue Celebrex 100 mg po daily for arthritic pain.  -Continue Hydrochlorothiazide 12.5 mg po q am for HTN.   Other PRNS -Continue Tylenol 650 mg every 6 hours PRN for mild pain -Continue Maalox 30 ml Q 4 hrs PRN for indigestion -Continue MOM 30 ml po Q 6 hrs for constipation.  -Continue Zofran 4 mg po tid prn for N/V.  -Continue Ibuprofen 600 mg po qid prn for pain.   Safety and Monitoring: Voluntary admission to inpatient psychiatric unit for safety, stabilization and treatment Daily contact with patient to assess and  evaluate symptoms and progress in treatment Patient's case to be discussed in multi-disciplinary team meeting Observation Level : q15 minute checks Vital signs: q12 hours Precautions: Safety   Discharge Planning: Social work and case management to assist with discharge planning and  identification of hospital follow-up needs prior to discharge Estimated LOS: 5-7 days Discharge Concerns: Need to establish a safety plan; Medication compliance and effectiveness Discharge Goals: Return home with outpatient referrals for mental health follow-up including medication management/psychotherapy  Armandina Stammer, NP, pmhnp, fnp-bc. 08/26/2023, 3:55 PM

## 2023-08-26 NOTE — [facility] Suicide Risk Assessment (Signed)
[facility] INPATIENT:  Family/Significant Other Suicide Prevention Education  Suicide Prevention Education:  Education Completed;  Destiny Simmons (husband) [phone],  has been identified by the patient as the family member/significant other with whom the patient will be residing, and identified as the person(s) who will aid the patient in the event of a mental health crisis (suicidal ideations/suicide attempt).  With written consent from the patient, the family member/significant other has been provided the following suicide prevention education, prior to the and/or following the discharge of the patient.  The suicide prevention education provided includes the following: Suicide risk factors Suicide prevention and interventions National Suicide Hotline telephone number [facility] assessment telephone number [facility] Emergency Assistance [facility] and/or Residential Mobile Crisis Unit telephone number  Request made of family/significant other to: Remove weapons (e.g., guns, rifles, knives), all items previously/currently identified as safety concern.   Remove drugs/medications (over-the-counter, prescriptions, illicit drugs), all items previously/currently identified as a safety concern.  The family member/significant other verbalizes understanding of the suicide prevention education information provided.  The family member/significant other agrees to remove the items of safety concern listed above.  Destiny Simmons 08/26/2023, 12:33 PM

## 2023-08-26 NOTE — Progress Notes (Signed)
   08/25/23 2138  Psych Admission Type (Psych Patients Only)  Admission Status Voluntary  Psychosocial Assessment  Patient Complaints Anxiety;Depression  Eye Contact Fair  Facial Expression Flat  Affect Anxious;Depressed  Speech Logical/coherent  Interaction Assertive  Motor Activity Other (Comment) (WNL)  Appearance/Hygiene Unremarkable  Behavior Characteristics Cooperative;Appropriate to situation  Mood Depressed;Anxious  Thought Process  Coherency WDL  Content WDL  Delusions None reported or observed  Perception WDL  Hallucination None reported or observed  Judgment Impaired  Confusion None  Danger to Self  Current suicidal ideation? Denies  Agreement Not to Harm Self Yes  Description of Agreement verbal  Danger to Others  Danger to Others None reported or observed   Pt was offered support and encouragement. Pt was given scheduled medications. Given PRN Ibuprofen per MAR. Q 15 minute checks were done for safety. Pt attended group and interacts well with peers and staff. Pt has no complaints.Pt receptive to treatment and safety maintained on unit.

## 2023-08-26 NOTE — Plan of Care (Signed)
  Problem: Activity: Goal: Interest or engagement in activities will improve Outcome: Progressing   Problem: Safety: Goal: Periods of time without injury will increase Outcome: Progressing   

## 2023-08-27 DIAGNOSIS — F1220 Cannabis dependence, uncomplicated: Secondary | ICD-10-CM | POA: Diagnosis present

## 2023-08-27 DIAGNOSIS — F411 Generalized anxiety disorder: Secondary | ICD-10-CM | POA: Insufficient documentation

## 2023-08-27 DIAGNOSIS — F332 Major depressive disorder, recurrent severe without psychotic features: Secondary | ICD-10-CM | POA: Diagnosis not present

## 2023-08-27 MED ORDER — ONDANSETRON HCL 4 MG PO TABS: 4.0000 mg | ORAL_TABLET | Freq: Four times a day (QID) | ORAL | Status: DC | PRN

## 2023-08-27 MED ORDER — QUETIAPINE FUMARATE 50 MG PO TABS
50.0000 mg | ORAL_TABLET | Freq: Every day | ORAL | Status: DC
  Administered 2023-08-27 – 2023-08-28 (×2): 50 mg via ORAL
  Filled 2023-08-27 (×3): qty 1

## 2023-08-27 MED ORDER — NICOTINE 21 MG/24HR TD PT24
21.0000 mg | MEDICATED_PATCH | Freq: Every day | TRANSDERMAL | Status: DC
  Administered 2023-08-27 – 2023-08-29 (×3): 21 mg via TRANSDERMAL
  Filled 2023-08-27 (×4): qty 1

## 2023-08-27 NOTE — Progress Notes (Addendum)
[facility] MD Progress Note  08/27/2023 1:11 PM Destiny Simmons  MRN:  [id number]  Reason for admission: 52 year old Caucasian female with prior hx mental health issues that include (bipolar disorder, depressed-type, anxiety disorder, PTSD & polysubstance use disorders. Admitted to the [facility] from the [facility] with complain of worsening of depression & suicidal ideations of two days, triggered by the recent death of Destiny Simmons 05/12/2023) & daughter's recent accidental overdose on fentanyl two weeks ago. Patient was apparently taken to the [facility] by her husband. After medical evaluation/clearance, [name] was transferred to the [facility] for further psychiatric evaluation/clearance. A review of her toxicology reports has shown her UDS positive for amphetamine, cocaine & THC.   24 Hr chart review: Sleep Hours last night: States that sleep was poor, states that she slept for only an hour even though as per nursing, she slept for 7.75 hrs. Nursing Concerns:  Behavioral episodes in the past 24 hrs: none reported Medication Compliance: Compliant  Vital Signs in the past 24 hrs: WNL PRN Medications in the past 24 hrs: Ibuprofen  Patient assessment note: On assessment today, the pt reports that their mood is depressed & anxious. Rates depression as 6, 10 being worst. Reports that anxiety is high. Rates anxiety as 7 (10 is worst). Sleep is poor  Appetite is normal Concentration is poor   Energy level is low  Denies suicidal thoughts. Denies suicidal intent and plan.  Denies having any HI.  Denies having psychotic symptoms.   Denies having side effects to current psychiatric medications, but reports intermittent nausea. States that the Rozerem helped with sleep for the first night, but did not help last night. Reports that Trazodone typically causes sedation. Low dose offered, and pt states that it will take a high dose to put her to sleep due to her high tolerance for meds. We discussed changes to current medication  regimen, including adding Seroquel 50 mg nightly for sleep, and keeping all other meds same. Adding Zofran 4 mg Q 4 H PRN for nausea.   Discussed the following psychosocial stressors: Relationship stressors with husband and daughter. States that husband is an angry person and daughter has not been able to forgive her for not being emotionally available to her when she was a little girl.  Principal Problem: MDD (major depressive disorder), recurrent severe, without psychosis (HCC)  Diagnosis: Principal Problem:   MDD (major depressive disorder), recurrent severe, without psychosis (HCC) Active Problems:   Hypothyroidism   Tobacco use   Cocaine use disorder (HCC)   GAD (generalized anxiety disorder)   Delta-9-tetrahydrocannabinol (THC) dependence (HCC)  Total Time spent with patient: 45 minutes  Past Psychiatric History:  Patient reports hx of bipolar disorder, major depressive disorder, PTSD & anxiety disorder. Also reports stimulant use disorder (crack cocaine), benzodiazepine use disorder & cannabis use disorder. Patient reports was in a treatment center in [place] a long time ago. Reports has been hospitalized at the [facility] in the past. Reports several suicide attempts & was on life support twice as a result of her suicide attempts. Says has tried multiple psychotropic medication that include; Effexor, Abilify, Sertraline Cymbalta & Wellbutrin.   Past Medical History:  Past Medical History:  Diagnosis Date   ADHD (attention deficit hyperactivity disorder)    Anxiety    Arthritis    Asthma    Bipolar disorder (HCC)    COPD (chronic obstructive pulmonary disease) (HCC)    Depression    History of borderline personality disorder  Malrotation of intestine    PTSD (post-traumatic stress disorder)    Sleep apnea    Thyroid disease 10/11/2017    Past Surgical History:  Procedure Laterality Date   ABDOMINAL HYSTERECTOMY     ABDOMINAL SURGERY     COLONOSCOPY W/ BIOPSIES   04/02/2015   2 sessile polyps sigmoid colon   ESOPHAGOGASTRODUODENOSCOPY ENDOSCOPY  04/02/2015   localized mildly erythomatous mucosa w/o active bleeding found in duodenal buld   Family History:  Family History  Problem Relation Age of Onset   Diabetes Destiny Simmons    Alcohol abuse Destiny Simmons    Anxiety disorder Destiny Simmons    Depression Destiny Simmons    Cancer Paternal Aunt        unknown cancer   Cancer Paternal Uncle        unknown cancer   Heart disease Paternal Grandfather    OCD Sister    Anxiety disorder Sister    Depression Sister    Bipolar disorder Sister    Diabetes Sister    ADD / ADHD Daughter    Bipolar disorder Daughter    ADD / ADHD Grandchild    Anxiety disorder Son    Depression Son    Family Psychiatric  History: See H&P.  Social History:  Social History   Substance and Sexual Activity  Alcohol Use No     Social History   Substance and Sexual Activity  Drug Use Yes   Types: Marijuana, Benzodiazepines    Social History   Socioeconomic History   Marital status: Married    Spouse name: Not on file   Number of children: Not on file   Years of education: Not on file   Highest education level: Not on file  Occupational History   Not on file  Tobacco Use   Smoking status: Every Day    Current packs/day: 1.00    Types: Cigarettes   Smokeless tobacco: Never  Vaping Use   Vaping status: Never Used  Substance and Sexual Activity   Alcohol use: No   Drug use: Yes    Types: Marijuana, Benzodiazepines   Sexual activity: Never    Birth control/protection: None  Other Topics Concern   Not on file  Social History Narrative   Not on file   Social Determinants of Health   Financial Resource Strain: Not on file  Food Insecurity: No Food Insecurity (08/24/2023)   Hunger Vital Sign    Worried About Running Out of Food in the Last Year: Never true    Ran Out of Food in the Last Year: Never true  Transportation Needs: No Transportation Needs (08/24/2023)   PRAPARE -  Administrator, Civil Service (Medical): No    Lack of Transportation (Non-Medical): No  Physical Activity: Not on file  Stress: Not on file  Social Connections: Not on file   Additional Social History:   Sleep: Good  Appetite:  Good  Current Medications: Current Facility-Administered Medications  Medication Dose Route Frequency Provider Last Rate Last Admin   albuterol (VENTOLIN HFA) 108 (90 Base) MCG/ACT inhaler 1-2 puff  1-2 puff Inhalation Q6H PRN Rex Kras, MD       alum & mag hydroxide-simeth (MAALOX/MYLANTA) 200-200-20 MG/5ML suspension 30 mL  30 mL Oral Q4H PRN Bennett, Christal H, NP       atorvastatin (LIPITOR) tablet 40 mg  40 mg Oral Daily Rex Kras, MD   40 mg at 08/27/23 0807   brexpiprazole (REXULTI) tablet 2 mg  2 mg  Oral Daily Armandina Stammer I, NP   2 mg at 08/27/23 1610   celecoxib (CELEBREX) capsule 100 mg  100 mg Oral Daily Rex Kras, MD   100 mg at 08/27/23 9604   diphenhydrAMINE (BENADRYL) capsule 50 mg  50 mg Oral TID PRN Willeen Cass, Christal H, NP       Or   diphenhydrAMINE (BENADRYL) injection 50 mg  50 mg Intramuscular TID PRN Bennett, Christal H, NP       diphenhydrAMINE (BENADRYL) capsule 50 mg  50 mg Oral QHS Armandina Stammer I, NP   50 mg at 08/26/23 2114   FLUoxetine (PROZAC) capsule 20 mg  20 mg Oral Daily Armandina Stammer I, NP   20 mg at 08/27/23 5409   haloperidol (HALDOL) tablet 5 mg  5 mg Oral TID PRN Bennett, Christal H, NP       Or   haloperidol lactate (HALDOL) injection 5 mg  5 mg Intramuscular TID PRN Bennett, Christal H, NP       hydrochlorothiazide (HYDRODIURIL) tablet 12.5 mg  12.5 mg Oral q morning Rex Kras, MD   12.5 mg at 08/27/23 8119   hydrOXYzine (ATARAX) tablet 50 mg  50 mg Oral TID PRN Thurston Hole H, NP   50 mg at 08/25/23 0729   ibuprofen (ADVIL) tablet 600 mg  600 mg Oral Q6H PRN Armandina Stammer I, NP   600 mg at 08/27/23 0558   LORazepam (ATIVAN) tablet 2 mg  2 mg Oral TID PRN Bennett, Christal H, NP        Or   LORazepam (ATIVAN) injection 2 mg  2 mg Intramuscular TID PRN Bennett, Christal H, NP       magnesium hydroxide (MILK OF MAGNESIA) suspension 30 mL  30 mL Oral Daily PRN Bennett, Christal H, NP       nicotine (NICODERM CQ - dosed in mg/24 hours) patch 21 mg  21 mg Transdermal Daily Massengill, Harrold Donath, MD   21 mg at 08/27/23 1478   nicotine polacrilex (NICORETTE) gum 2 mg  2 mg Oral PRN Phineas Inches, MD   2 mg at 08/26/23 1507   ondansetron (ZOFRAN) tablet 4 mg  4 mg Oral Q8H PRN Armandina Stammer I, NP       ramelteon (ROZEREM) tablet 8 mg  8 mg Oral QHS Armandina Stammer I, NP   8 mg at 08/26/23 2115    Lab Results:  Results for orders placed or performed during the hospital encounter of 08/24/23 (from the past 48 hour(s))  Lipid panel     Status: Abnormal   Collection Time: 08/26/23  6:29 AM  Result Value Ref Range   Cholesterol 155 0 - 200 mg/dL   Triglycerides 295 (H) <150 mg/dL   HDL 47 >62 mg/dL   Total CHOL/HDL Ratio 3.3 RATIO   VLDL 39 0 - 40 mg/dL   LDL Cholesterol 69 0 - 99 mg/dL    Comment:        Total Cholesterol/HDL:CHD Risk Coronary Heart Disease Risk Table                     Men   Women  1/2 Average Risk   3.4   3.3  Average Risk       5.0   4.4  2 X Average Risk   9.6   7.1  3 X Average Risk  23.4   11.0        Use the calculated Patient Ratio above and the  CHD Risk Table to determine the patient's CHD Risk.        ATP III CLASSIFICATION (LDL):  <100     mg/dL   Optimal  865-784  mg/dL   Near or Above                    Optimal  130-159  mg/dL   Borderline  696-295  mg/dL   High  >284     mg/dL   Very High Performed at [facility], [address] [place], [place] [address]   TSH     Status: None   Collection Time: 08/26/23  6:29 AM  Result Value Ref Range   TSH 4.395 0.350 - 4.500 uIU/mL    Comment: Performed by a 3rd Generation assay with a functional sensitivity of <=0.01 uIU/mL. Performed at [facility], [address], [place], [place] [address]   Hemoglobin A1c     Status: Abnormal   Collection Time: 08/26/23  6:29 AM  Result Value Ref Range   Hgb A1c MFr Bld 5.8 (H) 4.8 - 5.6 %    Comment: (NOTE) Pre diabetes:          5.7%-6.4%  Diabetes:              >6.4%  Glycemic control for   <7.0% adults with diabetes    Mean Plasma Glucose 119.76 mg/dL    Comment: Performed at [facility], [address] [place], [place] [address]    Blood Alcohol level:  Lab Results  Component Value Date   [facility] <10 08/24/2023   ETH <10 03/10/2018    Metabolic Disorder Labs: Lab Results  Component Value Date   HGBA1C 5.8 (H) 08/26/2023   MPG 119.76 08/26/2023   No results found for: "PROLACTIN" Lab Results  Component Value Date   CHOL 155 08/26/2023   TRIG 195 (H) 08/26/2023   HDL 47 08/26/2023   CHOLHDL 3.3 08/26/2023   VLDL 39 08/26/2023   LDLCALC 69 08/26/2023    Physical Findings: AIMS:  , ,  ,  ,    CIWA:    COWS:     Musculoskeletal: Strength & Muscle Tone: within normal limits Gait & Station: normal Patient leans: N/A  Psychiatric Specialty Exam:  Presentation  General Appearance:  Appropriate for Environment; Fairly Groomed  Eye Contact: Fair  Speech: Clear and Coherent  Speech Volume: Normal  Handedness:Right   Mood and Affect  Mood: Depressed; Anxious  Affect: Congruent   Thought Process  Thought Processes: Coherent  Descriptions of Associations:Intact  Orientation:Full (Time, Place and Person)  Thought Content:Logical  History of Schizophrenia/Schizoaffective disorder:No  Duration of Psychotic Symptoms:No data recorded Hallucinations:Hallucinations: None  Ideas of Reference:None  Suicidal Thoughts:Suicidal Thoughts: No SI Active Intent and/or Plan: Without Intent; Without Plan; Without Means to Carry Out; Without Access to Means  Homicidal Thoughts:Homicidal Thoughts: No   Sensorium  Memory: Immediate Good;  Recent Good; Remote Good  Judgment: Fair  Insight: Fair   Art therapist  Concentration: Fair  Attention Span: Good  Recall: Dudley Major of Knowledge: Fair  Language:Fair   Psychomotor Activity  Psychomotor Activity:Psychomotor Activity: Normal   Assets  Assets: Manufacturing systems engineer; Resilience   Sleep  Sleep:Sleep: Poor Number of Hours of Sleep: 7.5    Physical Exam: Physical Exam Vitals and nursing note reviewed.  HENT:     Nose: Nose normal.     Mouth/Throat:     Pharynx: Oropharynx  is clear.  Cardiovascular:     Rate and Rhythm: Normal rate.     Pulses: Normal pulses.  Pulmonary:     Effort: Pulmonary effort is normal.  Genitourinary:    Comments: Deferred Musculoskeletal:        General: Normal range of motion.     Cervical back: Normal range of motion.  Skin:    General: Skin is warm and dry.  Neurological:     General: No focal deficit present.     Mental Status: She is alert and oriented to person, place, and time.    Review of Systems  Constitutional:  Negative for chills, diaphoresis and fever.  HENT:  Negative for congestion and sore throat.   Respiratory:  Negative for cough, shortness of breath and wheezing.   Cardiovascular:  Negative for chest pain and palpitations.  Gastrointestinal:  Positive for nausea. Negative for heartburn.  Genitourinary:  Negative for dysuria.  Musculoskeletal:  Positive for joint pain and myalgias.  Neurological:  Negative for tremors, sensory change, speech change and loss of consciousness.  Psychiatric/Behavioral:  Positive for depression and substance abuse (Hx co use disorder). Negative for hallucinations, memory loss and suicidal ideas (Hx of). The patient is nervous/anxious and has insomnia.    Blood pressure (!) 122/92, pulse 94, temperature 98 F (36.7 C), temperature source Oral, resp. rate 16, height 5\' 2"  (1.575 m), weight 99.8 kg, SpO2 98%. Body mass index is 40.24 kg/m.  Treatment  Plan Summary: Daily contact with patient to assess and evaluate symptoms and progress in treatment and Medication management.   Continue inpatient hospitalization.  Will continue today 08/27/2023 plan as below except where it is noted.   Principal/active diagnoses. Major depressive disorder, recurrent episode, severe (HCC. Stimulant use disorder. Hx. Benzodiazepine use disorder.  Associated symptoms.  Suicidal ideations.  Insomnia.  Plan: The risks/benefits/side-effects/alternatives to the medications in use were discussed in detail with the patient and time was given for patient's questions. The patient consents to medication trial.    Medications -Start Seroquel 50 mg nightly for sleep -Continue Rexulti 2 mg po daily for mood control (home med).  -Discontinued Paxil 20 mg on admission (ineffective per pt's report).  -Continue Fluoxetine 20 mg po daily (to start 08-26-23). -Continue Hydroxyzine 50 mg po tid prn for anxiety.  -Continue Rozerem 8 mg po Q hs for insomnia.  -Continue Benadryl 50 mg po at bedtime to augment Rozerem.  -Continue Nicotine patch 212 mg topically Q 24 hours fpr nicotine withdrawal.    Agitation protocols: Cont as recommended;  -Benadryl 50 mg po or IM tid prn. -Haldol 5 mg po or IM tid prn.  -Lorazepam 2 mg po or IM tid prn.   Labsreviewed: TSH, lipid panel reviewed, hgba1c is slightly elevated at 5.8. Educated on healthy food choices and exercise. CMP, CBC & U/A reviewed.   Medical issues being addressed.  Resumed:  -Continue Albuterol inhaler 1-2 puff Q 6 hours prn for SOB. -Continue Atorvastatin 40 mg po daily for hyperlipidemia.  -Continue Celebrex 100 mg po daily for arthritic pain.  -Continue Hydrochlorothiazide 12.5 mg po q am for HTN.   Other PRNS -Continue Tylenol 650 mg every 6 hours PRN for mild pain -Continue Maalox 30 ml Q 4 hrs PRN for indigestion -Continue MOM 30 ml po Q 6 hrs for constipation.  -Continue Zofran 4 mg po tid prn for  N/V.  -Continue Ibuprofen 600 mg po qid prn for pain.   Safety and Monitoring: Voluntary admission to  inpatient psychiatric unit for safety, stabilization and treatment Daily contact with patient to assess and evaluate symptoms and progress in treatment Patient's case to be discussed in multi-disciplinary team meeting Observation Level : q15 minute checks Vital signs: q12 hours Precautions: Safety   Discharge Planning: Social work and case management to assist with discharge planning and identification of hospital follow-up needs prior to discharge Estimated LOS: 5-7 days Discharge Concerns: Need to establish a safety plan; Medication compliance and effectiveness Discharge Goals: Return home with outpatient referrals for mental health follow-up including medication management/psychotherapy  Starleen Blue, NP, pmhnp, 08/27/2023, 1:11 PM Patient ID: Destiny Simmons, female   DOB: 05-10-1971, 52 y.o.   MRN: [id number]

## 2023-08-27 NOTE — Progress Notes (Signed)
   08/26/23 2114  Psych Admission Type (Psych Patients Only)  Admission Status Voluntary  Psychosocial Assessment  Patient Complaints Depression;Anger  Eye Contact Fair  Facial Expression Flat  Affect Anxious;Depressed  Speech Logical/coherent  Interaction Assertive  Motor Activity Other (Comment) (WNL)  Appearance/Hygiene Unremarkable  Behavior Characteristics Cooperative  Mood Depressed  Thought Process  Coherency WDL  Content WDL  Delusions None reported or observed  Perception WDL  Hallucination None reported or observed  Judgment Impaired  Confusion None  Danger to Self  Current suicidal ideation? Denies  Agreement Not to Harm Self Yes  Description of Agreement verbal  Danger to Others  Danger to Others None reported or observed   Pt was offered support and encouragement. Pt was given scheduled medications. Given PRN Ibuprofen per MAR. Q 15 minute checks were done for safety. Pt attended group and interacts well with peers and staff. Pt has no complaints.Pt receptive to treatment and safety maintained on unit.

## 2023-08-27 NOTE — Plan of Care (Signed)
  Problem: Education: Goal: Knowledge of [place] [facility] Education information/materials will improve Outcome: Progressing Goal: Emotional status will improve Outcome: Progressing Goal: Mental status will improve Outcome: Progressing   

## 2023-08-27 NOTE — Plan of Care (Signed)
  Problem: Activity: Goal: Interest or engagement in activities will improve Outcome: Progressing   Problem: Coping: Goal: Ability to verbalize frustrations and anger appropriately will improve Outcome: Progressing   Problem: Safety: Goal: Periods of time without injury will increase Outcome: Progressing   

## 2023-08-27 NOTE — Progress Notes (Signed)
   08/27/23 2536  15 Minute Checks  Location Dayroom  Visual Appearance Calm  Behavior Composed  Sleep (Behavioral Health Patients Only)  Calculate sleep? (Click Yes once per 24 hr at 0600 safety check) Yes  Documented sleep last 24 hours 7.75

## 2023-08-27 NOTE — Progress Notes (Addendum)
D. Pt has been appropriate on the unit- has been visible in the milieu observed interacting well with peers. Pt reported poor sleep last night- stating that she only slept an hour. Pt currently denies SI/HI and AVH A. Labs and vitals monitored. Pt given and educated on medications. Pt supported emotionally and encouraged to express concerns and ask questions.   R. Pt remains safe with 15 minute checks. Will continue POC.    08/27/23 1100  Psych Admission Type (Psych Patients Only)  Admission Status Voluntary  Psychosocial Assessment  Patient Complaints Sleep disturbance  Eye Contact Fair  Facial Expression Sad  Affect Appropriate to circumstance  Speech Logical/coherent  Interaction Assertive  Motor Activity Other (Comment) (level 3 observation)  Appearance/Hygiene Unremarkable  Behavior Characteristics Cooperative  Mood Depressed  Thought Process  Coherency WDL  Content WDL  Delusions None reported or observed  Perception WDL  Hallucination None reported or observed  Judgment Impaired  Confusion None  Danger to Self  Current suicidal ideation? Denies  Agreement Not to Harm Self Yes  Description of Agreement agreed to contact staff before acting on harmful thoughts  Danger to Others  Danger to Others None reported or observed

## 2023-08-27 NOTE — [facility] Group Notes (Signed)
[facility] Group Notes:  (Nursing/MHT/Case Management/Adjunct)  Date:  08/27/2023  Time:  2000  Type of Therapy:   Wrap up group  Participation Level:  Active  Participation Quality:  Appropriate, Attentive, Sharing, and Supportive  Affect:  Depressed  Cognitive:  Alert  Insight:  Improving  Engagement in Group:  Engaged  Modes of Intervention:  Clarification, Education, and Support  Summary of Progress/Problems: Positive thinking and positive change were discussed.   Destiny Simmons 08/27/2023, 8:51 PM

## 2023-08-28 DIAGNOSIS — F332 Major depressive disorder, recurrent severe without psychotic features: Secondary | ICD-10-CM | POA: Diagnosis not present

## 2023-08-28 MED ORDER — PNEUMOCOCCAL 20-VAL CONJ VACC 0.5 ML IM SUSY
0.5000 mL | PREFILLED_SYRINGE | INTRAMUSCULAR | Status: DC
  Filled 2023-08-28: qty 0.5

## 2023-08-28 MED ORDER — HYDROCHLOROTHIAZIDE 25 MG PO TABS
25.0000 mg | ORAL_TABLET | Freq: Every morning | ORAL | Status: DC
  Administered 2023-08-29: 25 mg via ORAL
  Filled 2023-08-28 (×2): qty 1

## 2023-08-28 NOTE — Progress Notes (Signed)
   08/28/23 2132  Psych Admission Type (Psych Patients Only)  Admission Status Voluntary  Psychosocial Assessment  Patient Complaints None  Eye Contact Fair  Facial Expression Animated  Affect Appropriate to circumstance  Speech Logical/coherent  Interaction Assertive  Motor Activity Other (Comment) (WDL)  Appearance/Hygiene Unremarkable  Mood Pleasant;Anxious  Thought Process  Coherency WDL  Content WDL  Delusions None reported or observed  Perception WDL  Hallucination None reported or observed  Judgment WDL  Confusion None  Danger to Self  Current suicidal ideation? Denies  Self-Injurious Behavior No self-injurious ideation or behavior indicators observed or expressed   Agreement Not to Harm Self Yes  Description of Agreement verbal  Danger to Others  Danger to Others None reported or observed

## 2023-08-28 NOTE — Progress Notes (Signed)
[facility] MD Progress Note  08/28/2023 1:30 PM Destiny Simmons  MRN:  [id number]  Reason for admission: 52 year old Caucasian female with prior hx mental health issues that include (bipolar disorder, depressed-type, anxiety disorder, PTSD & polysubstance use disorders. Admitted to the [facility] from the [facility] with complain of worsening of depression & suicidal ideations of two days, triggered by the recent death of father 05/29/23) & daughter's recent accidental overdose on fentanyl two weeks ago. Patient was apparently taken to the [facility] by her husband. After medical evaluation/clearance, Destiny Simmons was transferred to the [facility] for further psychiatric evaluation/clearance. A review of her toxicology reports has shown her UDS positive for amphetamine, cocaine & THC.   24 Hr chart review: Sleep Hours last night: Sleep was good with the addition of the Seroquel 50 mg nightly as per patient. Nursing Concerns: none reported Behavioral episodes in the past 24 hrs: none reported Medication Compliance: Compliant  Vital Signs in the past 24 hrs: Elevated BP earlier today morning, repeated and WNL. HR slightly elevated at 111 earlier today morning. PRN Medications in the past 24 hrs: Ibuprofen & Hydroxyzine   Patient assessment note: On assessment today, the pt reports that their mood is significantly less depressed than at time of admission.  Mood is euthymic. Reports that anxiety is low. Sleep last night was good. Appetite is normal Concentration is good. Energy level is normal Denies suicidal thoughts. Denies suicidal intent and plan.  Denies having any HI.  Denies having psychotic symptoms.   Denies having side effects to current psychiatric medications.  Seroquel 50 mg nightly was added to her medication regimen for help with sleep, and patient states that this medication has been effective.  We will continue medications as listed below, and patient is agreeable to discharging tomorrow 9/1.  Discussed the  following psychosocial stressors: Relationship stressors with husband and daughter. States that she plans on seeking family therapy after discharge. Continuing medications as listed below, will discontinue the Remelteon since the Seroquel is effective for sleep. We will plan to discharge patient tomorrow. Increasing hydrochlorothiazide to 25 mg daily (from 12.5 mg daily) for management of hypertension.  Principal Problem: MDD (major depressive disorder), recurrent severe, without psychosis (HCC)  Diagnosis: Principal Problem:   MDD (major depressive disorder), recurrent severe, without psychosis (HCC) Active Problems:   Hypothyroidism   Tobacco use   Cocaine use disorder (HCC)   GAD (generalized anxiety disorder)   Delta-9-tetrahydrocannabinol (THC) dependence (HCC)  Total Time spent with patient: 45 minutes  Past Psychiatric History:  Patient reports hx of bipolar disorder, major depressive disorder, PTSD & anxiety disorder. Also reports stimulant use disorder (crack cocaine), benzodiazepine use disorder & cannabis use disorder. Patient reports was in a treatment center in [place] a long time ago. Reports has been hospitalized at the [facility] in the past. Reports several suicide attempts & was on life support twice as a result of her suicide attempts. Says has tried multiple psychotropic medication that include; Effexor, Abilify, Sertraline Cymbalta & Wellbutrin.   Past Medical History:  Past Medical History:  Diagnosis Date   ADHD (attention deficit hyperactivity disorder)    Anxiety    Arthritis    Asthma    Bipolar disorder (HCC)    COPD (chronic obstructive pulmonary disease) (HCC)    Depression    History of borderline personality disorder    Malrotation of intestine    PTSD (post-traumatic stress disorder)    Sleep apnea    Thyroid disease 10/11/2017  Past Surgical History:  Procedure Laterality Date   ABDOMINAL HYSTERECTOMY     ABDOMINAL SURGERY     COLONOSCOPY W/  BIOPSIES  04/02/2015   2 sessile polyps sigmoid colon   ESOPHAGOGASTRODUODENOSCOPY ENDOSCOPY  04/02/2015   localized mildly erythomatous mucosa w/o active bleeding found in duodenal buld   Family History:  Family History  Problem Relation Age of Onset   Diabetes Father    Alcohol abuse Father    Anxiety disorder Father    Depression Father    Cancer Paternal Aunt        unknown cancer   Cancer Paternal Uncle        unknown cancer   Heart disease Paternal Grandfather    OCD Sister    Anxiety disorder Sister    Depression Sister    Bipolar disorder Sister    Diabetes Sister    ADD / ADHD Daughter    Bipolar disorder Daughter    ADD / ADHD Grandchild    Anxiety disorder Son    Depression Son    Family Psychiatric  History: See H&P.  Social History:  Social History   Substance and Sexual Activity  Alcohol Use No     Social History   Substance and Sexual Activity  Drug Use Yes   Types: Marijuana, Benzodiazepines    Social History   Socioeconomic History   Marital status: Married    Spouse name: Not on file   Number of children: Not on file   Years of education: Not on file   Highest education level: Not on file  Occupational History   Not on file  Tobacco Use   Smoking status: Every Day    Current packs/day: 1.00    Types: Cigarettes   Smokeless tobacco: Never  Vaping Use   Vaping status: Never Used  Substance and Sexual Activity   Alcohol use: No   Drug use: Yes    Types: Marijuana, Benzodiazepines   Sexual activity: Never    Birth control/protection: None  Other Topics Concern   Not on file  Social History Narrative   Not on file   Social Determinants of Health   Financial Resource Strain: Not on file  Food Insecurity: No Food Insecurity (08/24/2023)   Hunger Vital Sign    Worried About Running Out of Food in the Last Year: Never true    Ran Out of Food in the Last Year: Never true  Transportation Needs: No Transportation Needs (08/24/2023)    PRAPARE - Administrator, Civil Service (Medical): No    Lack of Transportation (Non-Medical): No  Physical Activity: Not on file  Stress: Not on file  Social Connections: Not on file   Additional Social History:   Sleep: Good  Appetite:  Good  Current Medications: Current Facility-Administered Medications  Medication Dose Route Frequency Provider Last Rate Last Admin   albuterol (VENTOLIN HFA) 108 (90 Base) MCG/ACT inhaler 1-2 puff  1-2 puff Inhalation Q6H PRN Rex Kras, MD       alum & mag hydroxide-simeth (MAALOX/MYLANTA) 200-200-20 MG/5ML suspension 30 mL  30 mL Oral Q4H PRN Bennett, Christal H, NP       atorvastatin (LIPITOR) tablet 40 mg  40 mg Oral Daily Rex Kras, MD   40 mg at 08/28/23 0804   brexpiprazole (REXULTI) tablet 2 mg  2 mg Oral Daily Armandina Stammer I, NP   2 mg at 08/28/23 0805   celecoxib (CELEBREX) capsule 100 mg  100 mg Oral  Daily Rex Kras, MD   100 mg at 08/28/23 0804   diphenhydrAMINE (BENADRYL) capsule 50 mg  50 mg Oral TID PRN Bennett, Christal H, NP       Or   diphenhydrAMINE (BENADRYL) injection 50 mg  50 mg Intramuscular TID PRN Bennett, Christal H, NP       diphenhydrAMINE (BENADRYL) capsule 50 mg  50 mg Oral QHS Armandina Stammer I, NP   50 mg at 08/27/23 2105   FLUoxetine (PROZAC) capsule 20 mg  20 mg Oral Daily Armandina Stammer I, NP   20 mg at 08/28/23 0804   haloperidol (HALDOL) tablet 5 mg  5 mg Oral TID PRN Bennett, Christal H, NP       Or   haloperidol lactate (HALDOL) injection 5 mg  5 mg Intramuscular TID PRN Bennett, Christal H, NP       hydrochlorothiazide (HYDRODIURIL) tablet 12.5 mg  12.5 mg Oral q morning Rex Kras, MD   12.5 mg at 08/28/23 0805   hydrOXYzine (ATARAX) tablet 50 mg  50 mg Oral TID PRN Willeen Cass, Christal H, NP   50 mg at 08/25/23 0729   ibuprofen (ADVIL) tablet 600 mg  600 mg Oral Q6H PRN Armandina Stammer I, NP   600 mg at 08/28/23 4401   LORazepam (ATIVAN) tablet 2 mg  2 mg Oral TID PRN Bennett,  Christal H, NP       Or   LORazepam (ATIVAN) injection 2 mg  2 mg Intramuscular TID PRN Bennett, Christal H, NP       magnesium hydroxide (MILK OF MAGNESIA) suspension 30 mL  30 mL Oral Daily PRN Bennett, Christal H, NP       nicotine (NICODERM CQ - dosed in mg/24 hours) patch 21 mg  21 mg Transdermal Daily Massengill, Harrold Donath, MD   21 mg at 08/28/23 0805   nicotine polacrilex (NICORETTE) gum 2 mg  2 mg Oral PRN Phineas Inches, MD   2 mg at 08/26/23 1507   ondansetron (ZOFRAN) tablet 4 mg  4 mg Oral Q6H PRN Starleen Blue, NP       [START ON 08/29/2023] pneumococcal 20-valent conjugate vaccine (PREVNAR 20) injection 0.5 mL  0.5 mL Intramuscular Tomorrow-1000 Goli, Veeraindar, MD       QUEtiapine (SEROQUEL) tablet 50 mg  50 mg Oral QHS Ariyel Jeangilles, NP   50 mg at 08/27/23 2105    Lab Results:  No results found for this or any previous visit (from the past 48 hour(s)).   Blood Alcohol level:  Lab Results  Component Value Date   ETH <10 08/24/2023   ETH <10 03/10/2018    Metabolic Disorder Labs: Lab Results  Component Value Date   HGBA1C 5.8 (H) 08/26/2023   MPG 119.76 08/26/2023   No results found for: "PROLACTIN" Lab Results  Component Value Date   CHOL 155 08/26/2023   TRIG 195 (H) 08/26/2023   HDL 47 08/26/2023   CHOLHDL 3.3 08/26/2023   VLDL 39 08/26/2023   LDLCALC 69 08/26/2023    Physical Findings: AIMS:  , ,  ,  ,    CIWA:    COWS:     Musculoskeletal: Strength & Muscle Tone: within normal limits Gait & Station: normal Patient leans: N/A  Psychiatric Specialty Exam:  Presentation  General Appearance:  Appropriate for Environment; Casual  Eye Contact: Good  Speech: Clear and Coherent  Speech Volume: Normal  Handedness:Right   Mood and Affect  Mood: Euthymic  Affect: Appropriate; Congruent  Thought Process  Thought Processes: Coherent  Descriptions of Associations:Intact  Orientation:Full (Time, Place and Person)  Thought  Content:Logical  History of Schizophrenia/Schizoaffective disorder:No  Duration of Psychotic Symptoms:No data recorded Hallucinations:Hallucinations: None  Ideas of Reference:None  Suicidal Thoughts:Suicidal Thoughts: No  Homicidal Thoughts:Homicidal Thoughts: No   Sensorium  Memory: Immediate Good  Judgment: Good  Insight: Good   Executive Functions  Concentration: Good  Attention Span: Good  Recall: Good  Fund of Knowledge: Fair  Language:Good   Psychomotor Activity  Psychomotor Activity:Psychomotor Activity: Normal   Assets  Assets: Manufacturing systems engineer; Resilience; Social Support   Sleep  Sleep:Sleep: Good    Physical Exam: Physical Exam Vitals and nursing note reviewed.  HENT:     Nose: Nose normal.     Mouth/Throat:     Pharynx: Oropharynx is clear.  Cardiovascular:     Rate and Rhythm: Normal rate.     Pulses: Normal pulses.  Pulmonary:     Effort: Pulmonary effort is normal.  Genitourinary:    Comments: Deferred Musculoskeletal:        General: Normal range of motion.     Cervical back: Normal range of motion.  Skin:    General: Skin is warm and dry.  Neurological:     General: No focal deficit present.     Mental Status: She is alert and oriented to person, place, and time.    Review of Systems  Constitutional:  Negative for chills, diaphoresis and fever.  HENT:  Negative for congestion and sore throat.   Respiratory:  Negative for cough, shortness of breath and wheezing.   Cardiovascular:  Negative for chest pain and palpitations.  Gastrointestinal:  Positive for nausea. Negative for heartburn.  Genitourinary:  Negative for dysuria.  Musculoskeletal:  Positive for joint pain and myalgias.  Neurological:  Negative for tremors, sensory change, speech change and loss of consciousness.  Psychiatric/Behavioral:  Positive for depression and substance abuse (Hx co use disorder). Negative for hallucinations, memory loss and  suicidal ideas (Hx of). The patient is nervous/anxious and has insomnia.    Blood pressure 137/89, pulse (!) 111, temperature 97.9 F (36.6 C), temperature source Oral, resp. rate 16, height 5\' 2"  (1.575 m), weight 99.8 kg, SpO2 97%. Body mass index is 40.24 kg/m.  Treatment Plan Summary: Daily contact with patient to assess and evaluate symptoms and progress in treatment and Medication management.   Continue inpatient hospitalization.  Will continue today 08/28/2023 plan as below except where it is noted.   Principal/active diagnoses. Major depressive disorder, recurrent episode, severe (HCC. Stimulant use disorder. Hx. Benzodiazepine use disorder.  Associated symptoms.  Suicidal ideations.  Insomnia.  Plan: The risks/benefits/side-effects/alternatives to the medications in use were discussed in detail with the patient and time was given for patient's questions. The patient consents to medication trial.    Medications -Continue Seroquel 50 mg nightly for sleep -Continue Rexulti 2 mg po daily for mood control (home med).  -Discontinued Paxil 20 mg on admission (ineffective per pt's report).  -Continue Fluoxetine 20 mg po daily (to start 08-26-23). -Continue Hydroxyzine 50 mg po tid prn for anxiety.  -Discontinue Rozerem 8 mg po with addition of Seroquel to med regimen.  -Continue Benadryl 50 mg po at bedtime to augment Rozerem.  -Continue Nicotine patch 212 mg topically Q 24 hours fpr nicotine withdrawal.    Agitation protocols: Cont as recommended;  -Benadryl 50 mg po or IM tid prn. -Haldol 5 mg po or IM tid prn.  -Lorazepam 2 mg  po or IM tid prn.   Labsreviewed: TSH, lipid panel reviewed, hgba1c is slightly elevated at 5.8. Educated on healthy food choices and exercise. CMP, CBC & U/A reviewed.   Medical issues being addressed.  Resumed:  -Continue Albuterol inhaler 1-2 puff Q 6 hours prn for SOB. -Continue Atorvastatin 40 mg po daily for hyperlipidemia.  -Continue  Celebrex 100 mg po daily for arthritic pain.  -Continue Hydrochlorothiazide 12.5 mg po q am for HTN.   Other PRNS -Continue Tylenol 650 mg every 6 hours PRN for mild pain -Continue Maalox 30 ml Q 4 hrs PRN for indigestion -Continue MOM 30 ml po Q 6 hrs for constipation.  -Continue Zofran 4 mg po tid prn for N/V.  -Continue Ibuprofen 600 mg po qid prn for pain.   Safety and Monitoring: Voluntary admission to inpatient psychiatric unit for safety, stabilization and treatment Daily contact with patient to assess and evaluate symptoms and progress in treatment Patient's case to be discussed in multi-disciplinary team meeting Observation Level : q15 minute checks Vital signs: q12 hours Precautions: Safety   Discharge Planning: Social work and case management to assist with discharge planning and identification of hospital follow-up needs prior to discharge Estimated LOS: 5-7 days Discharge Concerns: Need to establish a safety plan; Medication compliance and effectiveness Discharge Goals: Return home with outpatient referrals for mental health follow-up including medication management/psychotherapy  Starleen Blue, NP, pmhnp. 08/28/2023, 1:30 PM Patient ID: Destiny Simmons, female   DOB: 25-May-1971, 52 y.o.   MRN: [id number]

## 2023-08-28 NOTE — Progress Notes (Addendum)
D. Pt has been appropriate during interactions- stated, "I'm ready to go home.". Pt has been visible in the milieu, observed attending group. Per pt's self inventory, pt rated her depression,hopelessness and anxiety all 1/10. Pt reported that she slept much better last night, endorsed having a good appetite, good concentration, and normal energy level. Pt currently denies SI/HI and AVH A. Labs and vitals monitored. Pt given and educated on medications. Pt supported emotionally and encouraged to express concerns and ask questions.   R. Pt remains safe with 15 minute checks. Will continue POC.    08/28/23 0900  Psych Admission Type (Psych Patients Only)  Admission Status Voluntary  Psychosocial Assessment  Patient Complaints None  Eye Contact Fair  Facial Expression Flat  Affect Appropriate to circumstance  Speech Logical/coherent  Interaction Assertive  Motor Activity Other (Comment) (steady gait)  Appearance/Hygiene Unremarkable  Behavior Characteristics Cooperative;Appropriate to situation  Mood Pleasant;Anxious  Thought Process  Coherency WDL  Content WDL  Delusions None reported or observed  Perception WDL  Hallucination None reported or observed  Judgment Impaired  Confusion None  Danger to Self  Current suicidal ideation? Denies  Agreement Not to Harm Self Yes  Description of Agreement verbal contract for safety  Danger to Others  Danger to Others None reported or observed

## 2023-08-28 NOTE — Plan of Care (Signed)

## 2023-08-28 NOTE — Progress Notes (Signed)
   08/27/23 2105  Psych Admission Type (Psych Patients Only)  Admission Status Voluntary  Psychosocial Assessment  Patient Complaints Depression  Eye Contact Fair  Facial Expression Flat  Affect Appropriate to circumstance  Speech Logical/coherent  Interaction Assertive  Motor Activity Other (Comment) (WNL)  Appearance/Hygiene Unremarkable  Behavior Characteristics Cooperative  Mood Depressed  Thought Process  Coherency WDL  Content WDL  Delusions None reported or observed  Perception WDL  Hallucination None reported or observed  Judgment Impaired  Confusion None  Danger to Self  Current suicidal ideation? Denies  Agreement Not to Harm Self Yes  Description of Agreement verbal  Danger to Others  Danger to Others None reported or observed   Pt is pleasant and cooperative. Pt was offered support and encouragement. Pt was given scheduled medications. Given PRN Ibuprofen per MAR. Q 15 minute checks were done for safety. Pt attended group and interacts well with peers and staff. Pt has no complaints.Pt receptive to treatment and safety maintained on unit.

## 2023-08-28 NOTE — [facility] Group Notes (Signed)
[facility] Group Notes:  (Nursing)  Date:  08/28/2023  Time:  145 pm  Type of Therapy:  Psychoeducational Skills  Participation Level:  Active  Participation Quality:  Appropriate and Attentive  Affect:  Appropriate  Cognitive:  Alert and Appropriate  Insight:  Appropriate and Good  Engagement in Group:  Engaged  Modes of Intervention:  Activity, Discussion, Exploration, Rapport Building, Socialization, and Support  Summary of Progress/Problems: Life Collage  The focus of this group is helping patients create a visual composition (collage) representing their thoughts emotions and experiences. Selecting and arranging these elements allow individuals to tap into their subconscious and express their emotions in a non verbal and symbolic way.   Destiny Simmons 08/28/2023, 4:23 PM

## 2023-08-28 NOTE — Plan of Care (Signed)
  Problem: Education: Goal: Emotional status will improve Outcome: Progressing   Problem: Activity: Goal: Interest or engagement in activities will improve Outcome: Progressing Goal: Sleeping patterns will improve Outcome: Progressing

## 2023-08-28 NOTE — [facility] Group Notes (Signed)
[facility] Group Notes:  (Nursing/MHT/Case Management/Adjunct)  Date:  08/28/2023  Time:  2000  Type of Therapy:   Wrap up group  Participation Level:  Active  Participation Quality:  Appropriate, Attentive, Sharing, and Supportive  Affect:  Appropriate  Cognitive:  Alert  Insight:  Improving  Engagement in Group:  Engaged  Modes of Intervention:  Clarification, Education, and Socialization  Summary of Progress/Problems: Positive thinking and self-care were discussed.   Destiny Simmons S 08/28/2023, 9:05 PM

## 2023-08-28 NOTE — Progress Notes (Signed)
   08/28/23 0606  15 Minute Checks  Location Bedroom  Visual Appearance Calm  Behavior Sleeping  Sleep (Behavioral Health Patients Only)  Calculate sleep? (Click Yes once per 24 hr at 0600 safety check) Yes  Documented sleep last 24 hours 7.5

## 2023-08-28 NOTE — [facility] Group Notes (Signed)
Adult Psychoeducational Group Note  Date:  08/28/2023 Time:  9:23 AM  Group Topic/Focus:  Goals Group:   The focus of this group is to help patients establish daily goals to achieve during treatment and discuss how the patient can incorporate goal setting into their daily lives to aide in recovery. Orientation:   The focus of this group is to educate the patient on the purpose and policies of crisis stabilization and provide a format to answer questions about their admission.  The group details unit policies and expectations of patients while admitted.  Participation Level:  Active  Participation Quality:  Attentive  Affect:  Appropriate  Cognitive:  Alert  Insight: Appropriate  Engagement in Group:  Engaged  Modes of Intervention:  Discussion  Additional Comments:  Patient attended and participated in the goals group.  Jearl Klinefelter 08/28/2023, 9:23 AM

## 2023-08-28 NOTE — Group Note (Addendum)
LCSW Group Therapy Note   Group Date: 08/28/2023 Start Time: 1000 End Time: 1100   . Type of Therapy and Topic:  Group Therapy: Embracing Change   Participation Level:  Active   Description of Group:   In this group, patients shared and discussed the importance change.  The group discussed how change plays a vial role in life and the decision that are made. A group discussion was facilitated in group to encourage participants to challenge negative thought with positives in their lives.  Therapeutic Goals: Patients will identify how the word "Change" resonates with them  Patients will discuss how they handle change Patients will explore negative and positive emotions related to change. Patients will verbalize what change look like for them/   Summary of Patient Progress:  The patient shared that change scary, change is good.  Patient's reaction to the group was very involved and committed to the topic. Pt shared that she would lash out due to change and rejection. She would like peace and boundaries incorporated for change.   Therapeutic Modalities:   Solution-Focused Therapy Activity  Steffanie Dunn, LCSWA 08/28/2023  11:44 AM

## 2023-08-29 ENCOUNTER — Encounter (HOSPITAL_COMMUNITY): Payer: Self-pay

## 2023-08-29 DIAGNOSIS — F332 Major depressive disorder, recurrent severe without psychotic features: Secondary | ICD-10-CM | POA: Diagnosis not present

## 2023-08-29 MED ORDER — HYDROXYZINE HCL 50 MG PO TABS: 50.0000 mg | ORAL_TABLET | Freq: Three times a day (TID) | ORAL | 0 refills | Status: AC | PRN

## 2023-08-29 MED ORDER — BREXPIPRAZOLE 2 MG PO TABS: 2.0000 mg | ORAL_TABLET | Freq: Every day | ORAL | 0 refills | Status: AC

## 2023-08-29 MED ORDER — FLUOXETINE HCL 20 MG PO CAPS: 20.0000 mg | ORAL_CAPSULE | Freq: Every day | ORAL | 0 refills | Status: AC

## 2023-08-29 MED ORDER — HYDROCHLOROTHIAZIDE 25 MG PO TABS: 25.0000 mg | ORAL_TABLET | Freq: Every morning | ORAL | 0 refills | Status: AC

## 2023-08-29 MED ORDER — QUETIAPINE FUMARATE 50 MG PO TABS: 50.0000 mg | ORAL_TABLET | Freq: Every day | ORAL | 0 refills | Status: AC

## 2023-08-29 MED ORDER — NICOTINE 21 MG/24HR TD PT24: 21.0000 mg | MEDICATED_PATCH | Freq: Every day | TRANSDERMAL | 0 refills | Status: AC

## 2023-08-29 MED ORDER — ATORVASTATIN CALCIUM 40 MG PO TABS: 40.0000 mg | ORAL_TABLET | Freq: Every day | ORAL | 0 refills | Status: AC

## 2023-08-29 NOTE — Discharge Summary (Signed)
Physician Discharge Summary Note  Patient:  Destiny Simmons is an 52 y.o., female MRN:  [id number] DOB:  01/13/1971 Patient phone:  [phone] (home)  Patient address:   [address] [place] [address],  Total Time spent with patient: 45 minutes  Date of Admission:  08/24/2023 Date of Discharge: 08/29/2023  Reason for Admission:  52 year old Caucasian female with prior hx mental health issues that include (bipolar disorder, depressed-type, anxiety disorder, PTSD & polysubstance use disorders. Admitted to the [facility] from the [facility] with complain of worsening of depression & suicidal ideations of two days, triggered by the recent death of father 06-02-23) & daughter's recent accidental overdose on fentanyl two weeks ago. Patient was apparently taken to the [facility] by her husband. After medical evaluation/clearance, Destiny Simmons was transferred to the [facility] for further psychiatric evaluation/clearance. A review of her toxicology reports has shown her UDS positive for amphetamine, cocaine & THC.   Principal Problem: MDD (major depressive disorder), recurrent severe, without psychosis (HCC) Discharge Diagnoses: Principal Problem:   MDD (major depressive disorder), recurrent severe, without psychosis (HCC) Active Problems:   Hypothyroidism   Tobacco use   Cocaine use disorder (HCC)   GAD (generalized anxiety disorder)   Delta-9-tetrahydrocannabinol (THC) dependence (HCC)  Past Psychiatric History: See H & P  Past Medical History:  Past Medical History:  Diagnosis Date   ADHD (attention deficit hyperactivity disorder)    Anxiety    Arthritis    Asthma    Bipolar disorder (HCC)    COPD (chronic obstructive pulmonary disease) (HCC)    Depression    History of borderline personality disorder    Malrotation of intestine    PTSD (post-traumatic stress disorder)    Sleep apnea    Thyroid disease 10/11/2017    Past Surgical History:  Procedure Laterality Date   ABDOMINAL  HYSTERECTOMY     ABDOMINAL SURGERY     COLONOSCOPY W/ BIOPSIES  04/02/2015   2 sessile polyps sigmoid colon   ESOPHAGOGASTRODUODENOSCOPY ENDOSCOPY  04/02/2015   localized mildly erythomatous mucosa w/o active bleeding found in duodenal buld   Family History:  Family History  Problem Relation Age of Onset   Diabetes Father    Alcohol abuse Father    Anxiety disorder Father    Depression Father    Cancer Paternal Aunt        unknown cancer   Cancer Paternal Uncle        unknown cancer   Heart disease Paternal Grandfather    OCD Sister    Anxiety disorder Sister    Depression Sister    Bipolar disorder Sister    Diabetes Sister    ADD / ADHD Daughter    Bipolar disorder Daughter    ADD / ADHD Grandchild    Anxiety disorder Son    Depression Son    Family Psychiatric  History: See H & P Social History: Social History   Substance and Sexual Activity  Alcohol Use No     Social History   Substance and Sexual Activity  Drug Use Yes   Types: Marijuana, Benzodiazepines    Social History   Socioeconomic History   Marital status: Married    Spouse name: Not on file   Number of children: Not on file   Years of education: Not on file   Highest education level: Not on file  Occupational History   Not on file  Tobacco Use   Smoking status: Every Day  Current packs/day: 1.00    Types: Cigarettes   Smokeless tobacco: Never  Vaping Use   Vaping status: Never Used  Substance and Sexual Activity   Alcohol use: No   Drug use: Yes    Types: Marijuana, Benzodiazepines   Sexual activity: Never    Birth control/protection: None  Other Topics Concern   Not on file  Social History Narrative   Not on file   Social Determinants of Health   Financial Resource Strain: Not on file  Food Insecurity: No Food Insecurity (08/24/2023)   Hunger Vital Sign    Worried About Running Out of Food in the Last Year: Never true    Ran Out of Food in the Last Year: Never true   Transportation Needs: No Transportation Needs (08/24/2023)   PRAPARE - Administrator, Civil Service (Medical): No    Lack of Transportation (Non-Medical): No  Physical Activity: Not on file  Stress: Not on file  Social Connections: Not on file   Hospital Course:   During the patient's hospitalization, patient had extensive initial psychiatric evaluation, and follow-up psychiatric evaluations every day. Psychiatric diagnoses provided upon initial assessment are as listed above. Patient's psychiatric medications were adjusted on admission:  -Increased Rexulti from 1 mg to 2 mg po daily for mood control (home med).  -Discontinued Paxil 20 mg (ineffective per pt's report).  -Initiated Fluoxetine 20 mg po daily (to start 08-26-23). -Continue Hydroxyzine 50 mg po tid prn for anxiety.  -Initiated Rozerem 8 mg po Q hs for insomnia.  -Initiated Benadryl 50 mg po at bedtime x once to augment Rozerem.  -Continue Nicotine patch 212 mg topically Q 24 hours fpr nicotine withdrawal.    During the hospitalization, other adjustments were made to the patient's psychiatric medication regimen. -Discontinue Rozerem 8 mg po with addition of Seroquel to med regimen. Medications at discharge are as follows: -Continue Seroquel 50 mg nightly for sleep -Continue Rexulti 2 mg po daily for mood control (home med).  -Continue Fluoxetine 20 mg po daily -Continue Hydroxyzine 50 mg po tid prn for anxiety.    Medical Medications -Continue Albuterol inhaler 1-2 puff Q 6 hours prn for SOB. -Continue Atorvastatin 40 mg po daily for hyperlipidemia.  -Continue Celebrex 100 mg po daily for arthritic pain.  -Continue Hydrochlorothiazide 12.5 mg po q am for HTN.   Patient's care was discussed during the interdisciplinary team meeting every day during the hospitalization. The patient denies having side effects to prescribed psychiatric medication. Gradually, patient started adjusting to milieu. The patient was  evaluated each day by a clinical provider to ascertain response to treatment. Improvement was noted by the patient's report of decreasing symptoms, improved sleep and appetite, affect, medication tolerance, behavior, and participation in unit programming.  Patient was asked each day to complete a self inventory noting mood, mental status, pain, new symptoms, anxiety and concerns.     Symptoms were reported as significantly decreased or resolved completely by discharge. On day of discharge, the patient reports that their mood is stable. The patient denied having suicidal thoughts for more than 48 hours prior to discharge.  Patient denies having homicidal thoughts.  Patient denies having auditory hallucinations.  Patient denies any visual hallucinations or other symptoms of psychosis. The patient was motivated to continue taking medication with a goal of continued improvement in mental health.    The patient reports their target psychiatric symptoms of depression, anxiety and insomnia responded well to the psychiatric medications, and the patient  reports overall benefit other psychiatric hospitalization. Supportive psychotherapy was provided to the patient. The patient also participated in regular group therapy while hospitalized. Coping skills, problem solving as well as relaxation therapies were also part of the unit programming.   Labs were reviewed with the patient, and abnormal results were discussed with the patient. Hemoglobin A1C is 5.8 rendering pt a prediabetic, educated on healthy food choices and exercise. TSH WNL, Lipid panel WNL with exception of Triglycerides which are slightly elevated at 195. Toxicology screen positive for Amphetamines, cocaine & THC. Educated on the negative impact of substance abuse on her mental health. Educated pt to follow up regarding other labs with her PCP. Verbalized understanding.   The patient is able to verbalize their individual safety plan to this provider.   #  It is recommended to the patient to continue psychiatric medications as prescribed, after discharge from the hospital.     # It is recommended to the patient to follow up with your outpatient psychiatric provider and PCP.   # It was discussed with the patient, the impact of alcohol, drugs, tobacco have been there overall psychiatric and medical wellbeing, and total abstinence from substance use was recommended the patient.ed.   # Prescriptions provided or sent directly to preferred pharmacy at discharge. Patient agreeable to plan. Given opportunity to ask questions. Appears to feel comfortable with discharge.    # In the event of worsening symptoms, the patient is instructed to call the crisis hotline (988), 911 and or go to the nearest ED for appropriate evaluation and treatment of symptoms. To follow-up with primary care provider for other medical issues, concerns and or health care needs   # Patient was discharged with a plan to follow up as noted below.    Total Time spent with patient: 45 minutes Physical Findings: AIMS: 0 CIWA: N/a COWS: n/a  Musculoskeletal: Strength & Muscle Tone: within normal limits Gait & Station: normal Patient leans: N/A Psychiatric Specialty Exam:  Presentation  General Appearance:  Appropriate for Environment; Casual  Eye Contact: Good  Speech: Clear and Coherent  Speech Volume: Normal  Handedness: Right   Mood and Affect  Mood: Euthymic  Affect: Congruent   Thought Process  Thought Processes: Coherent  Descriptions of Associations:Intact  Orientation:Full (Time, Place and Person)  Thought Content:Logical  History of Schizophrenia/Schizoaffective disorder:No  Duration of Psychotic Symptoms:No data recorded Hallucinations:Hallucinations: None  Ideas of Reference:None  Suicidal Thoughts:Suicidal Thoughts: No  Homicidal Thoughts:Homicidal Thoughts: No   Sensorium  Memory: Immediate  Good  Judgment: Fair  Insight: Fair   Art therapist  Concentration: Good  Attention Span: Good  Recall: Good  Fund of Knowledge: Fair  Language: Good   Psychomotor Activity  Psychomotor Activity: Psychomotor Activity: Normal   Assets  Assets: Communication Skills; Resilience   Sleep  Sleep: Sleep: Good    Physical Exam: Physical Exam Review of Systems  Constitutional:  Negative for diaphoresis.  HENT: Negative.    Eyes: Negative.   Respiratory: Negative.    Cardiovascular: Negative.   Gastrointestinal: Negative.   Genitourinary: Negative.   Musculoskeletal: Negative.   Skin: Negative.   Neurological: Negative.   Psychiatric/Behavioral:  Positive for depression (Improved significantly, denied SI/HI, denies AVH, denied plan or intent to harms self or others prior to discharge.) and substance abuse (Educated on cessation). Negative for hallucinations, memory loss and suicidal ideas. The patient is nervous/anxious (Improving) and has insomnia (Improving).    Blood pressure 121/88, pulse 87, temperature 97.9 F (36.6 C),  temperature source Oral, resp. rate 16, height 5\' 2"  (1.575 m), weight 99.8 kg, SpO2 100%. Body mass index is 40.24 kg/m.   Social History   Tobacco Use  Smoking Status Every Day   Current packs/day: 1.00   Types: Cigarettes  Smokeless Tobacco Never   Tobacco Cessation:  A prescription for an FDA-approved tobacco cessation medication provided at discharge   Blood Alcohol level:  Lab Results  Component Value Date   [facility] <10 08/24/2023   ETH <10 03/10/2018    Metabolic Disorder Labs:  Lab Results  Component Value Date   HGBA1C 5.8 (H) 08/26/2023   MPG 119.76 08/26/2023   No results found for: "PROLACTIN" Lab Results  Component Value Date   CHOL 155 08/26/2023   TRIG 195 (H) 08/26/2023   HDL 47 08/26/2023   CHOLHDL 3.3 08/26/2023   VLDL 39 08/26/2023   LDLCALC 69 08/26/2023    See Psychiatric Specialty Exam  and Suicide Risk Assessment completed by Attending Physician prior to discharge.  Discharge destination:  Home  Is patient on multiple antipsychotic therapies at discharge:  Yes,   Do you recommend tapering to monotherapy for antipsychotics?  Yes   Has Patient had three or more failed trials of antipsychotic monotherapy by history:  No  Recommended Plan for Multiple Antipsychotic Therapies: Taper to monotherapy as described:  Outpatient provider to monitor symptoms and titrate down to one antipsychotic medication, as symptoms continue to improve. Pt reported that Seroquel is the only medication which helps her with sleep, but then, was resistant to the Rexulti being discontinued so that we can manage mood just with Seroquel, and stated that the Rexulti helps with racing thoughts and overall moos stabilization, while Seroquel helps with sleep.  Allergies as of 08/29/2023       Reactions   Fluocinolone Itching   Keflex [cephalexin] Hives, Itching, Rash, Other (See Comments)   Unknown reaction, but not anaphylaxis   Latex Rash, Itching   Penicillins Rash   Unknown; as a child Pt unsure of reaction, occurred when she was young   Ciprofloxacin Hives, Itching   cipro   Dog Epithelium Itching, Cough   Dyclonine Other (See Comments)   Blisters    Hydrocodone-acetaminophen Hives, Itching, Nausea Only, Nausea And Vomiting   Itchting, nausea Itchting, nausea   Lamotrigine    Blisters   Meperidine Nausea And Vomiting, Other (See Comments)   Propoxyphene Nausea And Vomiting   Vicodin [hydrocodone-acetaminophen] Hives, Itching, Nausea Only, Nausea And Vomiting   Itchting, nausea   Codeine Diarrhea, Nausea And Vomiting   Pollen Extract Itching, Rash   Tape Rash        Medication List     STOP taking these medications    celecoxib 100 MG capsule Commonly known as: CELEBREX   gabapentin 100 MG capsule Commonly known as: NEURONTIN   ondansetron 4 MG disintegrating tablet Commonly  known as: Zofran ODT   PARoxetine 20 MG tablet Commonly known as: PAXIL   zolpidem 10 MG tablet Commonly known as: AMBIEN       TAKE these medications      Indication  atorvastatin 40 MG tablet Commonly known as: LIPITOR Take 1 tablet (40 mg total) by mouth daily.  Indication: High Amount of Fats in the Blood   brexpiprazole 2 MG Tabs tablet Commonly known as: REXULTI Take 1 tablet (2 mg total) by mouth daily. Start taking on: August 30, 2023 What changed:  medication strength how much to take  Indication: Major Depressive  Disorder   FLUoxetine 20 MG capsule Commonly known as: PROZAC Take 1 capsule (20 mg total) by mouth daily. Start taking on: August 30, 2023  Indication: Major Depressive Disorder   hydrochlorothiazide 25 MG tablet Commonly known as: HYDRODIURIL Take 1 tablet (25 mg total) by mouth every morning. What changed:  medication strength how much to take  Indication: High Blood Pressure Disorder   hydrOXYzine 50 MG tablet Commonly known as: ATARAX Take 1 tablet (50 mg total) by mouth 3 (three) times daily as needed for anxiety. What changed:  medication strength how much to take when to take this reasons to take this  Indication: Feeling Anxious   nicotine 21 mg/24hr patch Commonly known as: NICODERM CQ - dosed in mg/24 hours Place 1 patch (21 mg total) onto the skin daily. Start taking on: August 30, 2023  Indication: Nicotine Addiction   Proventil HFA 108 (90 Base) MCG/ACT inhaler Generic drug: albuterol Inhale 1-2 puffs into the lungs every 6 (six) hours as needed for wheezing or shortness of breath.  Indication: Asthma   QUEtiapine 50 MG tablet Commonly known as: SEROQUEL Take 1 tablet (50 mg total) by mouth at bedtime.  Indication: insomnia        Follow-up Information     [facility] Go on 09/07/2023.   Why: You have a hospital follow up appointment on 09/07/23 at 11:00 am. The appointment will be held in  person.  Following this appointment, you will be scheduled for necessary therapy and medication management services. Contact information: [address] [place] [address] [phone]                Signed: Starleen Blue, NP 08/29/2023, 3:47 PM

## 2023-08-29 NOTE — Progress Notes (Signed)
  [facility] Adult Case Management Discharge Plan :  Will you be returning to the same living situation after discharge:  Yes,  Oluwafunmilayo Whipkey (husband) [phone]  At discharge, do you have transportation home?: Yes,  Tashae Glenny (husband) [phone]  Do you have the ability to pay for your medications: Yes,  Insured  Release of information consent forms completed and in the chart;  Patient's signature needed at discharge.  Patient to Follow up at:  Follow-up Information     [facility] Go on 09/07/2023.   Why: You have a hospital follow up appointment on 09/07/23 at 11:00 am. The appointment will be held in person.  Following this appointment, you will be scheduled for necessary therapy and medication management services. Contact information: [address] [place] [address] [phone]                 Next level of care provider has access to [facility] Link:no  Safety Planning and Suicide Prevention discussed: Yes,  Swetha Magnone (husband) [phone]      Has patient been referred to the Quitline?: Patient refused referral for treatment  Patient has been referred for addiction treatment: No known substance use disorder. Patient to continue working towards treatment goals after discharge. Patient no longer meets criteria for inpatient criteria per attending physician. Continue taking medications as prescribed, nursing to provide instructions at discharge. Follow up with all scheduled appointments.   Anna Livers S Javayah Magaw, LCSW 08/29/2023, 9:47 AM

## 2023-08-29 NOTE — Progress Notes (Signed)
   08/29/23 0545  15 Minute Checks  Location Bedroom  Visual Appearance Calm  Behavior Sleeping  Sleep (Behavioral Health Patients Only)  Calculate sleep? (Click Yes once per 24 hr at 0600 safety check) Yes  Documented sleep last 24 hours 6.5

## 2023-08-29 NOTE — Care Management Important Message (Signed)
Medicare IM printed and given to Marya Landry, LCSW to give to the patient before discharge.

## 2023-08-29 NOTE — Plan of Care (Signed)
  Problem: Activity: Goal: Sleeping patterns will improve Outcome: Progressing   Problem: Health Behavior/Discharge Planning: Goal: Compliance with treatment plan for underlying cause of condition will improve Outcome: Progressing

## 2023-08-29 NOTE — [facility] Suicide Risk Assessment (Addendum)
Suicide Risk Assessment  Discharge Assessment    [facility] Discharge Suicide Risk Assessment   Principal Problem: MDD (major depressive disorder), recurrent severe, without psychosis (HCC) Discharge Diagnoses: Principal Problem:   MDD (major depressive disorder), recurrent severe, without psychosis (HCC) Active Problems:   Hypothyroidism   Tobacco use   Cocaine use disorder (HCC)   GAD (generalized anxiety disorder)   Delta-9-tetrahydrocannabinol (THC) dependence (HCC)  Reason for admission: 52 year old Caucasian female with prior hx mental health issues that include (bipolar disorder, depressed-type, anxiety disorder, PTSD & polysubstance use disorders. Admitted to the [facility] from the [facility] with complain of worsening of depression & suicidal ideations of two days, triggered by the recent death of father 2023-06-03) & daughter's recent accidental overdose on fentanyl two weeks ago. Patient was apparently taken to the [facility] by her husband. After medical evaluation/clearance, Berdene was transferred to the [facility] for further psychiatric evaluation/clearance. A review of her toxicology reports has shown her UDS positive for amphetamine, cocaine & THC.   During the patient's hospitalization, patient had extensive initial psychiatric evaluation, and follow-up psychiatric evaluations every day. Psychiatric diagnoses provided upon initial assessment are as listed above. Patient's psychiatric medications were adjusted on admission:  -Increased Rexulti from 1 mg to 2 mg po daily for mood control (home med).  -Discontinued Paxil 20 mg (ineffective per pt's report).  -Initiated Fluoxetine 20 mg po daily (to start 08-26-23). -Continue Hydroxyzine 50 mg po tid prn for anxiety.  -Initiated Rozerem 8 mg po Q hs for insomnia.  -Initiated Benadryl 50 mg po at bedtime x once to augment Rozerem.  -Continue Nicotine patch 212 mg topically Q 24 hours fpr nicotine withdrawal.   During the hospitalization, other adjustments  were made to the patient's psychiatric medication regimen. -Discontinue Rozerem 8 mg po with addition of Seroquel to med regimen. Medications at discharge are as follows: -Continue Seroquel 50 mg nightly for sleep -Continue Rexulti 2 mg po daily for mood control (home med).  -Continue Fluoxetine 20 mg po daily -Continue Hydroxyzine 50 mg po tid prn for anxiety.   Medical Medications -Continue Albuterol inhaler 1-2 puff Q 6 hours prn for SOB. -Continue Atorvastatin 40 mg po daily for hyperlipidemia.  -Continue Celebrex 100 mg po daily for arthritic pain.  -Continue Hydrochlorothiazide 12.5 mg po q am for HTN.  Patient's care was discussed during the interdisciplinary team meeting every day during the hospitalization. The patient denies having side effects to prescribed psychiatric medication. Gradually, patient started adjusting to milieu. The patient was evaluated each day by a clinical provider to ascertain response to treatment. Improvement was noted by the patient's report of decreasing symptoms, improved sleep and appetite, affect, medication tolerance, behavior, and participation in unit programming.  Patient was asked each day to complete a self inventory noting mood, mental status, pain, new symptoms, anxiety and concerns.    Symptoms were reported as significantly decreased or resolved completely by discharge. On day of discharge, the patient reports that their mood is stable. The patient denied having suicidal thoughts for more than 48 hours prior to discharge.  Patient denies having homicidal thoughts.  Patient denies having auditory hallucinations.  Patient denies any visual hallucinations or other symptoms of psychosis. The patient was motivated to continue taking medication with a goal of continued improvement in mental health.   The patient reports their target psychiatric symptoms of depression, anxiety and insomnia responded well to the psychiatric medications, and the patient reports  overall benefit other psychiatric hospitalization. Supportive  psychotherapy was provided to the patient. The patient also participated in regular group therapy while hospitalized. Coping skills, problem solving as well as relaxation therapies were also part of the unit programming.  Labs were reviewed with the patient, and abnormal results were discussed with the patient. Hemoglobin A1C is 5.8 rendering pt a prediabetic, educated on healthy food choices and exercise. TSH WNL, Lipid panel WNL with exception of Triglycerides which are slightly elevated at 195. Toxicology screen positive for Amphetamines, cocaine & THC. Educated on the negative impact of substance abuse on her mental health. Educated pt to follow up regarding other labs with her PCP. Verbalized understanding.  The patient is able to verbalize their individual safety plan to this provider.  # It is recommended to the patient to continue psychiatric medications as prescribed, after discharge from the hospital.    # It is recommended to the patient to follow up with your outpatient psychiatric provider and PCP.  # It was discussed with the patient, the impact of alcohol, drugs, tobacco have been there overall psychiatric and medical wellbeing, and total abstinence from substance use was recommended the patient.ed.  # Prescriptions provided or sent directly to preferred pharmacy at discharge. Patient agreeable to plan. Given opportunity to ask questions. Appears to feel comfortable with discharge.    # In the event of worsening symptoms, the patient is instructed to call the crisis hotline (988), 911 and or go to the nearest ED for appropriate evaluation and treatment of symptoms. To follow-up with primary care provider for other medical issues, concerns and or health care needs  # Patient was discharged with a plan to follow up as noted below.   Total Time spent with patient: 45 minutes  Musculoskeletal: Strength & Muscle Tone: within  normal limits Gait & Station: normal Patient leans: N/A  Psychiatric Specialty Exam  Presentation  General Appearance:  Appropriate for Environment; Casual  Eye Contact: Good  Speech: Clear and Coherent  Speech Volume: Normal  Handedness: Right   Mood and Affect  Mood: Euthymic  Duration of Depression Symptoms: Greater than two weeks  Affect: Congruent   Thought Process  Thought Processes: Coherent  Descriptions of Associations:Intact  Orientation:Full (Time, Place and Person)  Thought Content:Logical  History of Schizophrenia/Schizoaffective disorder:No  Duration of Psychotic Symptoms:No data recorded Hallucinations:Hallucinations: None  Ideas of Reference:None  Suicidal Thoughts:Suicidal Thoughts: No  Homicidal Thoughts:Homicidal Thoughts: No   Sensorium  Memory: Immediate Good  Judgment: Fair  Insight: Fair   Art therapist  Concentration: Good  Attention Span: Good  Recall: Good  Fund of Knowledge: Fair  Language: Good   Psychomotor Activity  Psychomotor Activity: Psychomotor Activity: Normal   Assets  Assets: Communication Skills; Resilience   Sleep  Sleep: Sleep: Good   Physical Exam: Physical Exam Constitutional:      Appearance: Normal appearance.  Musculoskeletal:     Cervical back: Normal range of motion.  Neurological:     General: No focal deficit present.     Mental Status: She is alert and oriented to person, place, and time.    Review of Systems  Constitutional: Negative.   HENT: Negative.    Eyes: Negative.   Respiratory: Negative.    Cardiovascular: Negative.   Gastrointestinal: Negative.   Genitourinary: Negative.   Musculoskeletal: Negative.   Skin: Negative.   Neurological: Negative.   Psychiatric/Behavioral:  Positive for depression (Denies SI/HI/AVH, denies intent or plan to harm self, verbally contracts for safety outside of [place]) and substance abuse.  Negative for  hallucinations and suicidal ideas (Educated on the negative impact of substance use on her mental health and on the need to cease using). The patient is nervous/anxious (Resolving on current meds) and has insomnia (REsolving on current meds).    Blood pressure 121/88, pulse 87, temperature 97.9 F (36.6 C), temperature source Oral, resp. rate 16, height 5\' 2"  (1.575 m), weight 99.8 kg, SpO2 100%. Body mass index is 40.24 kg/m.  Mental Status Per Nursing Assessment::   On Admission:  NA  Demographic Factors:  Caucasian and Low socioeconomic status  Loss Factors: Financial problems/change in socioeconomic status  Historical Factors: Family history of mental illness or substance abuse  Risk Reduction Factors:   Sense of responsibility to family and Living with another person, especially a relative  Continued Clinical Symptoms:  Alcohol/Substance Abuse/Dependencies More than one psychiatric diagnosis Previous Psychiatric Diagnoses and Treatments  Cognitive Features That Contribute To Risk:  None    Suicide Risk:  Mild:  There are no identifiable suicide plans, no associated intent, mild dysphoria and related symptoms, good self-control (both objective and subjective assessment), few other risk factors, and identifiable protective factors, including available and accessible social support.    Follow-up Information     [facility] Go on 09/07/2023.   Why: You have a hospital follow up appointment on 09/07/23 at 11:00 am. The appointment will be held in person.  Following this appointment, you will be scheduled for necessary therapy and medication management services. Contact information: [address] [place] [address] [phone]                Starleen Blue, NP 08/29/2023, 9:16 AM

## 2023-08-29 NOTE — Progress Notes (Signed)
   08/29/23 0803  Psych Admission Type (Psych Patients Only)  Admission Status Voluntary  Psychosocial Assessment  Patient Complaints None  Eye Contact Fair  Facial Expression Animated  Affect Appropriate to circumstance  Speech Logical/coherent  Interaction Assertive  Motor Activity Other (Comment) (WDL)  Appearance/Hygiene Unremarkable  Behavior Characteristics Cooperative  Mood Pleasant  Thought Process  Coherency WDL  Content WDL  Delusions None reported or observed  Perception WDL  Hallucination None reported or observed  Judgment WDL  Confusion None  Danger to Self  Current suicidal ideation? Denies  Agreement Not to Harm Self Yes  Description of Agreement verbal  Danger to Others  Danger to Others None reported or observed

## 2023-08-29 NOTE — Progress Notes (Signed)
Pt discharged home alert and oriented x 4, calm and cooperative. Discharge instructions  reviewed with pt and pt verbalized understanding of discharge instructions. Belongings returned to pt.

## 2023-10-19 ENCOUNTER — Emergency Department
Admission: EM | Admit: 2023-10-19 | Discharge: 2023-10-19 | Discharge status: Against Medical Advice | Payer: 59 | Attending: Emergency Medicine | Admitting: Emergency Medicine

## 2023-10-19 ENCOUNTER — Other Ambulatory Visit: Payer: Self-pay

## 2023-10-19 ENCOUNTER — Emergency Department: Payer: 59

## 2023-10-19 DIAGNOSIS — J449 Chronic obstructive pulmonary disease, unspecified: Secondary | ICD-10-CM | POA: Insufficient documentation

## 2023-10-19 DIAGNOSIS — J45909 Unspecified asthma, uncomplicated: Secondary | ICD-10-CM | POA: Diagnosis not present

## 2023-10-19 DIAGNOSIS — M25561 Pain in right knee: Secondary | ICD-10-CM | POA: Diagnosis present

## 2023-10-19 DIAGNOSIS — Z5329 Procedure and treatment not carried out because of patient's decision for other reasons: Secondary | ICD-10-CM | POA: Diagnosis not present

## 2023-10-19 NOTE — ED Provider Notes (Signed)
-----------------------------------------   3:14 PM on 10/19/2023 -----------------------------------------  Blood pressure (!) 148/98, pulse 97, temperature 98.2 F (36.8 C), resp. rate 20, height 5\' 2"  (1.575 m), weight 99.8 kg, SpO2 97%.  Assuming care from Greig Right, PA-C.  In short, Destiny Simmons is a 52 y.o. female with a chief complaint of Knee Pain .  Refer to the original H&P for additional details.  The current plan of care is to wait for ultrasound results and give a knee brace and refer to ortho if negative.  ____________________________________________    ED Results / Procedures / Treatments   Labs (all labs ordered are listed, but only abnormal results are displayed) Labs Reviewed - No data to display   RADIOLOGY  I personally viewed and evaluated these images as part of my medical decision making, as well as reviewing the written report by the radiologist.  ED Provider Interpretation: Negative.  DG Knee Complete 4 Views Right  Result Date: 10/19/2023 CLINICAL DATA:  Pain and swelling.  Symptoms for 1 month. EXAM: RIGHT KNEE - COMPLETE 4+ VIEW COMPARISON:  None Available. FINDINGS: No evidence of fracture, dislocation, or joint effusion. Normal joint spaces and alignment. No evidence of arthropathy or other focal bone abnormality. Soft tissues are unremarkable. IMPRESSION: Negative radiographs of the right knee. Electronically Signed   By: Narda Rutherford M.D.   On: 10/19/2023 16:28     PROCEDURES:  Critical Care performed: No  Procedures   MEDICATIONS ORDERED IN ED: Medications - No data to display   IMPRESSION / MDM / ASSESSMENT AND PLAN / ED COURSE  I reviewed the triage vital signs and the nursing notes.                              Differential diagnosis includes, but is not limited to, fracture, dislocation, ligament injury, meniscal injury, joint effusion, arthritis  Patient's presentation is most consistent with acute complicated illness  / injury requiring diagnostic workup.  Patient's knee x-rays were negative.  Patient eloped and left without completing care.  Nursing staff reached out to the patient and she stated she left due to an emergency.     FINAL CLINICAL IMPRESSION(S) / ED DIAGNOSES   Final diagnoses:  Acute pain of right knee     Rx / DC Orders   ED Discharge Orders     None        Note:  This document was prepared using Dragon voice recognition software and may include unintentional dictation errors.    Cameron Ali, PA-C 10/19/23 Shirlee Limerick, MD 10/19/23 2221

## 2023-10-19 NOTE — ED Triage Notes (Signed)
Pt c/o right knee pain and swelling x1 month. Pt reports hypertension, and mental health hx

## 2023-10-19 NOTE — ED Notes (Signed)
Patient was called and stated she left due to an emergency.

## 2023-10-19 NOTE — ED Provider Notes (Signed)
[facility] Provider Note    Event Date/Time   First MD Initiated Contact with Patient 10/19/23 1330     (approximate)   History   Knee Pain   HPI  Destiny Simmons is a 52 y.o. female with history of asthma, thyroid disease, ADHD, COPD and other psych problems presents emergency department with right knee pain for about a month.  States some swelling.  Pain behind the knee and at the top of the calf and radiating into the thigh.  No known injury.      Physical Exam   Triage Vital Signs: ED Triage Vitals  Encounter Vitals Group     BP 10/19/23 1315 (!) 148/98     Systolic BP Percentile --      Diastolic BP Percentile --      Pulse Rate 10/19/23 1315 97     Resp 10/19/23 1315 20     Temp 10/19/23 1315 98.2 F (36.8 C)     Temp src --      SpO2 10/19/23 1315 97 %     Weight 10/19/23 1317 220 lb (99.8 kg)     Height 10/19/23 1317 5\' 2"  (1.575 m)     Head Circumference --      Peak Flow --      Pain Score 10/19/23 1317 7     Pain Loc --      Pain Education --      Exclude from Growth Chart --     Most recent vital signs: Vitals:   10/19/23 1315  BP: (!) 148/98  Pulse: 97  Resp: 20  Temp: 98.2 F (36.8 C)  SpO2: 97%     General: Awake, no distress.   CV:  Good peripheral perfusion. regular rate and  rhythm Resp:  Normal effort.  Abd:  No distention.   Other:  Right knee slightly swollen, tender posteriorly, tender along the great saphenous vein, tender some in the upper portions of the calf, negative Homans' sign   ED Results / Procedures / Treatments   Labs (all labs ordered are listed, but only abnormal results are displayed) Labs Reviewed - No data to display   EKG     RADIOLOGY X-ray of the right knee, ultrasound right lower extremity for DVT    PROCEDURES:   Procedures   MEDICATIONS ORDERED IN ED: Medications - No data to display   IMPRESSION / MDM / ASSESSMENT AND PLAN / ED COURSE  I reviewed the  triage vital signs and the nursing notes.                              Differential diagnosis includes, but is not limited to, knee effusion, sprain, tendinitis, DVT  Patient's presentation is most consistent with acute illness / injury with system symptoms.   X-ray of the right knee, ultrasound right lower extremity   I did independently review interpret the x-ray of the right knee as being negative for acute abnormality, pending radiologist read  Ultrasound has not been performed yet.  Will transfer care to Cruz Condon, PA-C.  Plan is to evaluate ultrasound for DVT if negative Place patient in knee brace and refer to Ortho.   FINAL CLINICAL IMPRESSION(S) / ED DIAGNOSES   Final diagnoses:  Acute pain of right knee     Rx / DC Orders   ED Discharge Orders     None  Note:  This document was prepared using Dragon voice recognition software and may include unintentional dictation errors.    Faythe Ghee, PA-C 10/19/23 1517    Trinna Post, MD 10/19/23 [phone]

## 2024-03-08 ENCOUNTER — Other Ambulatory Visit: Payer: Self-pay

## 2024-03-08 ENCOUNTER — Emergency Department (HOSPITAL_BASED_OUTPATIENT_CLINIC_OR_DEPARTMENT_OTHER)

## 2024-03-08 ENCOUNTER — Emergency Department (HOSPITAL_BASED_OUTPATIENT_CLINIC_OR_DEPARTMENT_OTHER)
Admission: EM | Admit: 2024-03-08 | Discharge: 2024-03-08 | Disposition: A | Discharge status: Home / Self Care | Attending: Emergency Medicine | Admitting: Emergency Medicine

## 2024-03-08 ENCOUNTER — Encounter (HOSPITAL_BASED_OUTPATIENT_CLINIC_OR_DEPARTMENT_OTHER): Payer: Self-pay | Admitting: Emergency Medicine

## 2024-03-08 DIAGNOSIS — Z91040 Latex allergy status: Secondary | ICD-10-CM | POA: Insufficient documentation

## 2024-03-08 DIAGNOSIS — X500XXA Overexertion from strenuous movement or load, initial encounter: Secondary | ICD-10-CM | POA: Insufficient documentation

## 2024-03-08 DIAGNOSIS — M25511 Pain in right shoulder: Secondary | ICD-10-CM | POA: Insufficient documentation

## 2024-03-08 MED ORDER — DEXAMETHASONE SODIUM PHOSPHATE 10 MG/ML IJ SOLN
10.0000 mg | Freq: Once | INTRAMUSCULAR | Status: AC
  Administered 2024-03-08: 10 mg via INTRAMUSCULAR
  Filled 2024-03-08: qty 1

## 2024-03-08 MED ORDER — IBUPROFEN 600 MG PO TABS
600.0000 mg | ORAL_TABLET | Freq: Four times a day (QID) | ORAL | 0 refills | Status: AC | PRN
Start: 2024-03-08 — End: ?

## 2024-03-08 MED ORDER — LIDOCAINE 5 % EX PTCH: 1.0000 | MEDICATED_PATCH | CUTANEOUS | 0 refills | Status: DC

## 2024-03-08 MED ORDER — PREDNISONE 20 MG PO TABS: 60.0000 mg | ORAL_TABLET | Freq: Every day | ORAL | 0 refills | Status: AC

## 2024-03-08 MED ORDER — METHOCARBAMOL 500 MG PO TABS
500.0000 mg | ORAL_TABLET | Freq: Once | ORAL | Status: AC
  Administered 2024-03-08: 500 mg via ORAL
  Filled 2024-03-08: qty 1

## 2024-03-08 MED ORDER — METHOCARBAMOL 500 MG PO TABS
500.0000 mg | ORAL_TABLET | Freq: Two times a day (BID) | ORAL | 0 refills | Status: AC
Start: 2024-03-08 — End: ?

## 2024-03-08 MED ORDER — KETOROLAC TROMETHAMINE 30 MG/ML IJ SOLN
15.0000 mg | Freq: Once | INTRAMUSCULAR | Status: AC
  Administered 2024-03-08: 15 mg via INTRAMUSCULAR
  Filled 2024-03-08: qty 1

## 2024-03-08 NOTE — ED Provider Notes (Signed)
 [facility] Provider Note   CSN: [id number] Arrival date & time: 03/08/24  4098     History  Chief Complaint  Patient presents with   Shoulder Pain    Destiny Simmons is a 53 y.o. female.  Destiny Simmons is a 53 y.o. female with a history of prior rotator cuff injury and repair a few months ago who presents for 2 months of pain in the right shoulder.  She reports pain is primarily present over the top of the shoulder in the deltoid region it is worse when trying to reach overhead or reach behind her.  This does feel similar to her prior rotator cuff injury.  She reports pain started after she was moving and doing a lot of heavy lifting.  She has tried ibuprofen twice daily for the past week without much improvement.  Has an upcoming appointment with her orthopedist but came in today due to worsening pain.  No numbness or weakness in the arm.  The history is provided by the patient.  Shoulder Pain Associated symptoms: no fever        Home Medications Prior to Admission medications   Medication Sig Start Date End Date Taking? Authorizing Provider  albuterol (PROVENTIL HFA) 108 (90 Base) MCG/ACT inhaler Inhale 1-2 puffs into the lungs every 6 (six) hours as needed for wheezing or shortness of breath. 12/16/15   [provider]  atorvastatin (LIPITOR) 40 MG tablet Take 1 tablet (40 mg total) by mouth daily. 08/29/23   Starleen Blue, NP  brexpiprazole (REXULTI) 2 MG TABS tablet Take 1 tablet (2 mg total) by mouth daily. 08/30/23   Starleen Blue, NP  FLUoxetine (PROZAC) 20 MG capsule Take 1 capsule (20 mg total) by mouth daily. 08/30/23   Starleen Blue, NP  hydrochlorothiazide (HYDRODIURIL) 25 MG tablet Take 1 tablet (25 mg total) by mouth every morning. 08/29/23   Starleen Blue, NP  hydrOXYzine (ATARAX) 50 MG tablet Take 1 tablet (50 mg total) by mouth 3 (three) times daily as needed for anxiety. 08/29/23   Nkwenti, Tyler Aas, NP  nicotine  (NICODERM CQ - DOSED IN MG/24 HOURS) 21 mg/24hr patch Place 1 patch (21 mg total) onto the skin daily. 08/30/23   Starleen Blue, NP  QUEtiapine (SEROQUEL) 50 MG tablet Take 1 tablet (50 mg total) by mouth at bedtime. 08/29/23   Starleen Blue, NP      Allergies    Fluocinolone, Keflex [cephalexin], Latex, Penicillins, Ciprofloxacin, Dog epithelium, Dyclonine, Hydrocodone-acetaminophen, Lamotrigine, Meperidine, Propoxyphene, Vicodin [hydrocodone-acetaminophen], Codeine, Pollen extract, and Tape    Review of Systems   Review of Systems  Constitutional:  Negative for chills and fever.  Musculoskeletal:  Positive for arthralgias.    Physical Exam Updated Vital Signs BP (!) 136/96 (BP Location: Left Arm)   Pulse 88   Temp 98.2 F (36.8 C) (Oral)   Resp 16   Ht 5\' 2"  (1.575 m)   Wt 104.3 kg   SpO2 99%   BMI 42.07 kg/m  Physical Exam Vitals and nursing note reviewed.  Constitutional:      General: She is not in acute distress.    Appearance: Normal appearance. She is well-developed. She is not ill-appearing or diaphoretic.  HENT:     Head: Normocephalic and atraumatic.  Eyes:     General:        Right eye: No discharge.        Left eye: No discharge.  Pulmonary:  Effort: Pulmonary effort is normal. No respiratory distress.  Musculoskeletal:     Comments: Tenderness over the superior aspects of the right shoulder with palpable tension in the trapezius muscle, active and passive range of motion intact although decreased abduction and and extension due to pain.  Increased pain in the shoulder with Hawkins test and empty can test.  Distal pulses 2+, normal range of motion and strength at the elbow and wrist.  5/5 strength of the shoulder despite pain  Neurological:     Mental Status: She is alert and oriented to person, place, and time.     Coordination: Coordination normal.  Psychiatric:        Mood and Affect: Mood normal.        Behavior: Behavior normal.     ED Results /  Procedures / Treatments   Labs (all labs ordered are listed, but only abnormal results are displayed) Labs Reviewed - No data to display  EKG None  Radiology DG Shoulder Right Result Date: 03/08/2024 CLINICAL DATA:  Right shoulder pain after moving furniture 2 months ago. EXAM: RIGHT SHOULDER - 2+ VIEW COMPARISON:  None Available. FINDINGS: There is no evidence of fracture or dislocation. There is no evidence of arthropathy or other focal bone abnormality. Soft tissues are unremarkable. IMPRESSION: Negative. Electronically Signed   By: Lupita Raider M.D.   On: 03/08/2024 12:46     Procedures Procedures    Medications Ordered in ED Medications - No data to display  ED Course/ Medical Decision Making/ A&P                                 Medical Decision Making Amount and/or Complexity of Data Reviewed Radiology: ordered.  Risk Prescription drug management.   53 year old female presents with 2 months of right shoulder pain.  The upper extremity is neurovascularly intact without obvious deformity at the shoulder.  X-ray of the right shoulder without any bony deformity dislocation or other obvious abnormality.  Patient has had prior rotator cuff repair and symptoms are concerning for potential new rotator cuff pathology.  Will treat with short course of steroids muscle relaxers and NSAIDs to help with pain patient has upcoming follow-up appointment with her orthopedist for further evaluation.  At this time she is stable for discharge home, return precautions provided.        Final Clinical Impression(s) / ED Diagnoses Final diagnoses:  Acute pain of right shoulder    Rx / DC Orders ED Discharge Orders          Ordered    predniSONE (DELTASONE) 20 MG tablet  Daily        03/08/24 1239    methocarbamol (ROBAXIN) 500 MG tablet  2 times daily        03/08/24 1239    ibuprofen (ADVIL) 600 MG tablet  Every 6 hours PRN        03/08/24 1239    lidocaine (LIDODERM) 5 %   Every 24 hours        03/08/24 1239              Rosezella Rumpf, PA-C 03/15/24 0851    Rexford Maus, DO 03/16/24 1558

## 2024-03-08 NOTE — ED Triage Notes (Signed)
 Rt shoulder pain after injuring it 2 months ago states cannot sleep has appointment next week to see ortho

## 2024-03-08 NOTE — Discharge Instructions (Signed)
 Could be rotator cuff issues given that you have prior history of this.  Follow-up with Dr. Luiz Blare next week as planned.  Continue using ibuprofen 3 times daily, use steroids for the next 5 days starting tomorrow to help reduce pain and inflammation and use prescribed muscle relaxer 2 times daily as needed for spasm and pain.  You can also use over-the-counter or prescription lidocaine patches on the shoulder to help with pain.  Can also try using ice or heat.

## 2024-08-06 ENCOUNTER — Emergency Department
Admission: EM | Admit: 2024-08-06 | Discharge: 2024-08-06 | Disposition: A | Discharge status: Home / Self Care | Attending: Emergency Medicine | Admitting: Emergency Medicine

## 2024-08-06 ENCOUNTER — Other Ambulatory Visit: Payer: Self-pay

## 2024-08-06 DIAGNOSIS — K148 Other diseases of tongue: Secondary | ICD-10-CM | POA: Diagnosis present

## 2024-08-06 MED ORDER — LIDOCAINE VISCOUS HCL 2 % MT SOLN
15.0000 mL | Freq: Once | OROMUCOSAL | Status: AC
  Administered 2024-08-06 (×2): 15 mL via OROMUCOSAL
  Filled 2024-08-06: qty 15

## 2024-08-06 MED ORDER — LIDOCAINE VISCOUS HCL 2 % MT SOLN: 10.0000 mL | OROMUCOSAL | 0 refills | Status: DC | PRN

## 2024-08-06 MED ORDER — MAGIC MOUTHWASH W/LIDOCAINE: 5.0000 mL | Freq: Four times a day (QID) | ORAL | 0 refills | Status: AC

## 2024-08-06 NOTE — Discharge Instructions (Addendum)
 Please schedule an appointment with ENT for further evaluation of mouth lesions.  Call Dr. Rumalda today to schedule an appointment. As discussed, you can also try and schedule an appointment with your dentist for further evaluation.

## 2024-08-06 NOTE — ED Provider Notes (Signed)
 [facility] Emergency Department Provider Note     Event Date/Time   First MD Initiated Contact with Patient 08/06/24 1147     (approximate)   History   No chief complaint on file.   HPI  Destiny Simmons is a 53 y.o. female presents to the ED for evaluation of a recurrent painful lesions to her tongue. States the lesions will be white and then open and become a hole. She states these lesions will appear and disappear spontaneously.  was told by her dentist to seek ED evaluation.  Endorses chronic daily vaping. No fever, known trauma, difficulty swallowing or vomiting. Patient is able to tolerate her secretions. Denies shortness of breath or chest pain.    Physical Exam   Triage Vital Signs: ED Triage Vitals  Encounter Vitals Group     BP 08/06/24 1051 (!) 148/108     Girls Systolic BP Percentile --      Girls Diastolic BP Percentile --      Boys Systolic BP Percentile --      Boys Diastolic BP Percentile --      Pulse Rate 08/06/24 1051 (!) 105     Resp 08/06/24 1051 16     Temp 08/06/24 1051 98.3 F (36.8 C)     Temp Source 08/06/24 1051 Oral     SpO2 08/06/24 1051 94 %     Weight 08/06/24 1050 229 lb 15 oz (104.3 kg)     Height --      Head Circumference --      Peak Flow --      Pain Score 08/06/24 1049 8     Pain Loc --      Pain Education --      Exclude from Growth Chart --     Most recent vital signs: Vitals:   08/06/24 1051  BP: (!) 148/108  Pulse: (!) 105  Resp: 16  Temp: 98.3 F (36.8 C)  SpO2: 94%    General Awake, no distress.  HEENT NCAT.  CV:  Good peripheral perfusion.  RESP:  Normal effort.  ABD:  No distention.  Other:  Single 1-64mm ulcer like lesion to proximal left lateral border of tongue. No bleeding. Mild tenderness to palpation. No thrush or scrapable lesions noted.  No exudates on pharynx.  Uvula is midline.  ED Results / Procedures / Treatments   Labs (all labs ordered are listed, but only abnormal  results are displayed) Labs Reviewed - No data to display  No results found.  PROCEDURES:  Critical Care performed: No  Procedures  MEDICATIONS ORDERED IN ED: Medications  lidocaine  (XYLOCAINE ) 2 % viscous mouth solution 15 mL (15 mLs Mouth/Throat Given 08/06/24 1236)    IMPRESSION / MDM / ASSESSMENT AND PLAN / ED COURSE  I reviewed the triage vital signs and the nursing notes.                               53 y.o. female presents to the emergency department for evaluation and treatment of mouth ulcers. See HPI for further details.   Differential diagnosis includes, but is not limited to stomatitis, leukoplakia, thrush, laceration  Patient's presentation is most consistent with acute complicated illness / injury requiring diagnostic workup.  Patient is alert and oriented.  She is hemodynamic stable.  Physical exam findings are stated above.  No indication for further workup.  Advised follow-up with ENT  for further evaluation of lesions.  Also advised follow-up with dentist if cannot make an appoint with ENT which she verbalized understanding.  She is in stable condition for discharge home.  ED management with lidocaine  viscous for pain relief.  Prescribed magic mouthwash for comfort.  ED precaution discussed.  FINAL CLINICAL IMPRESSION(S) / ED DIAGNOSES   Final diagnoses:  Tongue lesion   Rx / DC Orders   ED Discharge Orders          Ordered    lidocaine  (XYLOCAINE ) 2 % solution  Every 4 hours PRN,   Status:  Discontinued        08/06/24 1231    magic mouthwash w/lidocaine  SOLN  4 times daily        08/06/24 1232            Note:  This document was prepared using Dragon voice recognition software and may include unintentional dictation errors.    Margrette, Casimiro Lienhard A, PA-C 08/06/24 1502    Suzanne Kirsch, MD 08/06/24 1615

## 2024-08-06 NOTE — ED Triage Notes (Signed)
 C/O bump on side of tongue x 6 weeks.  Symptoms have worsened. Has history of same for 2 years. NAD

## 2024-09-10 ENCOUNTER — Other Ambulatory Visit: Payer: Self-pay | Admitting: Family Medicine

## 2024-09-10 DIAGNOSIS — Z1231 Encounter for screening mammogram for malignant neoplasm of breast: Secondary | ICD-10-CM
